# Patient Record
Sex: Male | Born: 1970 | Race: Black or African American | Hispanic: No | Marital: Single
Health system: Southern US, Community
[De-identification: ages and names within clinical notes are randomized; demographics above are authoritative.]

## PROBLEM LIST (undated history)

## (undated) DIAGNOSIS — I10 Essential (primary) hypertension: Secondary | ICD-10-CM

## (undated) DIAGNOSIS — H3321 Serous retinal detachment, right eye: Secondary | ICD-10-CM

## (undated) HISTORY — PX: EYE SURGERY: SHX253

---
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
## 2015-08-16 ENCOUNTER — Encounter (HOSPITAL_BASED_OUTPATIENT_CLINIC_OR_DEPARTMENT_OTHER): Payer: Self-pay | Admitting: Emergency Medicine

## 2015-08-16 ENCOUNTER — Emergency Department (HOSPITAL_BASED_OUTPATIENT_CLINIC_OR_DEPARTMENT_OTHER)
Admission: EM | Admit: 2015-08-16 | Discharge: 2015-08-16 | Disposition: A | Discharge status: Home / Self Care | Payer: BLUE CROSS/BLUE SHIELD | Attending: Emergency Medicine | Admitting: Emergency Medicine

## 2015-08-16 DIAGNOSIS — H5461 Unqualified visual loss, right eye, normal vision left eye: Secondary | ICD-10-CM | POA: Diagnosis present

## 2015-08-16 DIAGNOSIS — H3321 Serous retinal detachment, right eye: Secondary | ICD-10-CM | POA: Insufficient documentation

## 2015-08-16 DIAGNOSIS — Z973 Presence of spectacles and contact lenses: Secondary | ICD-10-CM | POA: Insufficient documentation

## 2015-08-16 MED ORDER — TETRACAINE HCL 0.5 % OP SOLN
OPHTHALMIC | Status: AC
  Administered 2015-08-16: 2 [drp]
  Filled 2015-08-16: qty 4

## 2015-08-16 NOTE — ED Notes (Signed)
Pt called for triage x2, no answer. 

## 2015-08-16 NOTE — ED Provider Notes (Signed)
CSN: [id number]     Arrival date & time 08/16/15  2249 History  By signing my name below, I, Phillis Haggis, attest that this documentation has been prepared under the direction and in the presence of Gwyneth Sprout, MD. Electronically Signed: Phillis Haggis, ED Scribe. 08/16/2015. 11:02 PM.  Chief Complaint  Patient presents with  . Loss of Vision   The history is provided by the patient. No language interpreter was used.  HPI Comments: Eric [name] is a 45 y.o. Male with a hx of cataracts who presents to the Emergency Department complaining of sudden onset right eye frontal vision loss onset one day ago. Pt states that he was at work when he could not see out of his right eye at 4 PM, although he retains mild peripheral view to the right. He reports that he can tell that the light in the room is on, but cannot see objects. "It feels like a shade has been pulled down over my eye." Pt also notes pressure on his eye, although he does not experience pain. Pt is able to see normally out of his left eye. Pt wears contacts. Pt has not seen his ophthalmologist for this problem. He denies hx of similar symptoms, family hx of glaucoma, HTN, DM, eye surgeries, nausea, or recent head injury.   No past medical history on file. No past surgical history on file. No family history on file. Social History  Substance Use Topics  . Smoking status: Not on file  . Smokeless tobacco: Not on file  . Alcohol Use: Not on file    Review of Systems  Eyes: Positive for visual disturbance. Negative for pain.  Gastrointestinal: Negative for nausea.  All other systems reviewed and are negative.  Allergies  Review of patient's allergies indicates not on file.  Home Medications   Prior to Admission medications   Not on File   BP 141/95 mmHg  Pulse 67  Resp 18  SpO2 99% Physical Exam  Constitutional: He is oriented to person, place, and time. He appears well-developed and well-nourished.  HENT:  Head:  Normocephalic and atraumatic.  Eyes: Pupils are equal, round, and reactive to light.  Equal pupils bilaterally; right eye can only distinguish light and dark; interocular pressure: 16  Neck: Normal range of motion. Neck supple.  Musculoskeletal: Normal range of motion.  Neurological: He is alert and oriented to person, place, and time.  Skin: Skin is warm and dry.  Psychiatric: He has a normal mood and affect. His behavior is normal.  Nursing note and vitals reviewed.   ED Course  Procedures (including critical care time) DIAGNOSTIC STUDIES: Oxygen Saturation is 99% on RA, normal by my interpretation.    COORDINATION OF CARE: 11:07 PM-Discussed treatment plan which includes [place] of eye with pt at bedside and pt agreed to plan.   EMERGENCY DEPARTMENT [place] OCULAR EXAM "Study: Limited Ultrasound of Orbit "  INDICATIONS: Vision loss  Linear probe utilized to obtain images in both long and short axis of the orbit having the patient look left and right if possible.  PERFORMED BY: Myself  IMAGES ARCHIVED?: Yes  LIMITATIONS: none  VIEWS USED: Right orbit  INTERPRETATION: Retinal detachment present    Labs Review Labs Reviewed - No data to display  Imaging Review No results found. I have personally reviewed and evaluated these images and lab results as part of my medical decision-making.   EKG Interpretation None      MDM   Final diagnoses:  Vision  loss of right eye  Retinal detachment, right    Patient is a 45 year old healthy male with no history of diabetes or hypertension presenting with 36 hours of acute painless vision loss that started yesterday around 4 PM when he got off for. He denies any injury to his head. No prior similar symptoms.  Patient has no eye pain. Pupils are reactive. Only light and dark perception of the right eye.  Patient wears contacts and has a history of mild cataracts without ever having surgery.  Bedside ultrasound shows a retinal  detachment. Spoke with Dr. Randon Goldsmith and he wanted to follow-up with patient tomorrow morning at 8:30.  I personally performed the services described in this documentation, which was scribed in my presence.  The recorded information has been reviewed and considered.      Gwyneth Sprout, MD 08/16/15 [phone]

## 2015-08-16 NOTE — ED Notes (Signed)
Called [facility] for Ophthalmology

## 2015-08-16 NOTE — ED Notes (Signed)
Pt in c/o loss of central vision in R eye onset 2 days ago. States has peripheral vision intact. Neuro intact.

## 2015-08-16 NOTE — ED Notes (Signed)
C/o of loss of vision straight ahead in rt eye onset yesterday  Denies inj  No pain

## 2019-04-18 ENCOUNTER — Emergency Department (HOSPITAL_COMMUNITY): Payer: Managed Care, Other (non HMO)

## 2019-04-18 ENCOUNTER — Encounter (HOSPITAL_COMMUNITY): Payer: Self-pay

## 2019-04-18 ENCOUNTER — Encounter (HOSPITAL_COMMUNITY): Admission: EM | Disposition: A | Discharge status: Rehabilitation Facility | Payer: Self-pay | Source: Home / Self Care

## 2019-04-18 ENCOUNTER — Emergency Department (HOSPITAL_COMMUNITY): Payer: Managed Care, Other (non HMO) | Admitting: Anesthesiology

## 2019-04-18 ENCOUNTER — Other Ambulatory Visit: Payer: Self-pay

## 2019-04-18 ENCOUNTER — Inpatient Hospital Stay (HOSPITAL_COMMUNITY)
Admission: EM | Admit: 2019-04-18 | Discharge: 2019-04-27 | DRG: 481 | Disposition: A | Discharge status: Rehabilitation Facility | Payer: Managed Care, Other (non HMO) | Attending: General Surgery | Admitting: General Surgery

## 2019-04-18 DIAGNOSIS — Z419 Encounter for procedure for purposes other than remedying health state, unspecified: Secondary | ICD-10-CM

## 2019-04-18 DIAGNOSIS — S0219XA Other fracture of base of skull, initial encounter for closed fracture: Secondary | ICD-10-CM | POA: Diagnosis present

## 2019-04-18 DIAGNOSIS — H4311 Vitreous hemorrhage, right eye: Secondary | ICD-10-CM | POA: Diagnosis present

## 2019-04-18 DIAGNOSIS — S8991XA Unspecified injury of right lower leg, initial encounter: Secondary | ICD-10-CM | POA: Insufficient documentation

## 2019-04-18 DIAGNOSIS — H1131 Conjunctival hemorrhage, right eye: Secondary | ICD-10-CM | POA: Diagnosis present

## 2019-04-18 DIAGNOSIS — S0591XA Unspecified injury of right eye and orbit, initial encounter: Secondary | ICD-10-CM

## 2019-04-18 DIAGNOSIS — S72402B Unspecified fracture of lower end of left femur, initial encounter for open fracture type I or II: Secondary | ICD-10-CM | POA: Diagnosis not present

## 2019-04-18 DIAGNOSIS — R402252 Coma scale, best verbal response, oriented, at arrival to emergency department: Secondary | ICD-10-CM | POA: Diagnosis present

## 2019-04-18 DIAGNOSIS — I10 Essential (primary) hypertension: Secondary | ICD-10-CM | POA: Diagnosis present

## 2019-04-18 DIAGNOSIS — Z20828 Contact with and (suspected) exposure to other viral communicable diseases: Secondary | ICD-10-CM | POA: Diagnosis present

## 2019-04-18 DIAGNOSIS — N179 Acute kidney failure, unspecified: Secondary | ICD-10-CM | POA: Diagnosis present

## 2019-04-18 DIAGNOSIS — Y92410 Unspecified street and highway as the place of occurrence of the external cause: Secondary | ICD-10-CM

## 2019-04-18 DIAGNOSIS — S72352B Displaced comminuted fracture of shaft of left femur, initial encounter for open fracture type I or II: Secondary | ICD-10-CM | POA: Diagnosis not present

## 2019-04-18 DIAGNOSIS — S82141A Displaced bicondylar fracture of right tibia, initial encounter for closed fracture: Secondary | ICD-10-CM

## 2019-04-18 DIAGNOSIS — E871 Hypo-osmolality and hyponatremia: Secondary | ICD-10-CM | POA: Diagnosis present

## 2019-04-18 DIAGNOSIS — S0240CA Maxillary fracture, right side, initial encounter for closed fracture: Secondary | ICD-10-CM

## 2019-04-18 DIAGNOSIS — S0285XA Fracture of orbit, unspecified, initial encounter for closed fracture: Secondary | ICD-10-CM | POA: Diagnosis present

## 2019-04-18 DIAGNOSIS — S0121XA Laceration without foreign body of nose, initial encounter: Secondary | ICD-10-CM

## 2019-04-18 DIAGNOSIS — S0530XA Ocular laceration without prolapse or loss of intraocular tissue, unspecified eye, initial encounter: Secondary | ICD-10-CM | POA: Diagnosis present

## 2019-04-18 DIAGNOSIS — S058X1A Other injuries of right eye and orbit, initial encounter: Secondary | ICD-10-CM | POA: Diagnosis present

## 2019-04-18 DIAGNOSIS — K5900 Constipation, unspecified: Secondary | ICD-10-CM | POA: Diagnosis present

## 2019-04-18 DIAGNOSIS — R52 Pain, unspecified: Secondary | ICD-10-CM | POA: Diagnosis not present

## 2019-04-18 DIAGNOSIS — R402362 Coma scale, best motor response, obeys commands, at arrival to emergency department: Secondary | ICD-10-CM | POA: Diagnosis present

## 2019-04-18 DIAGNOSIS — R402142 Coma scale, eyes open, spontaneous, at arrival to emergency department: Secondary | ICD-10-CM | POA: Diagnosis present

## 2019-04-18 HISTORY — DX: Serous retinal detachment, right eye: H33.21

## 2019-04-18 HISTORY — PX: FEMUR IM NAIL: SHX1597

## 2019-04-18 HISTORY — PX: EYE EXAMINATION UNDER ANESTHESIA: SHX1560

## 2019-04-18 LAB — CBC
HCT: 48.4 % (ref 39.0–52.0)
Hemoglobin: 16.3 g/dL (ref 13.0–17.0)
MCH: 29.8 pg (ref 26.0–34.0)
MCHC: 33.7 g/dL (ref 30.0–36.0)
MCV: 88.5 fL (ref 80.0–100.0)
Platelets: 210 10*3/uL (ref 150–400)
RBC: 5.47 MIL/uL (ref 4.22–5.81)
RDW: 12.8 % (ref 11.5–15.5)
WBC: 13.8 10*3/uL — ABNORMAL HIGH (ref 4.0–10.5)
nRBC: 0 % (ref 0.0–0.2)

## 2019-04-18 LAB — I-STAT CHEM 8, ED
BUN: 16 mg/dL (ref 6–20)
Calcium, Ion: 1.01 mmol/L — ABNORMAL LOW (ref 1.15–1.40)
Chloride: 105 mmol/L (ref 98–111)
Creatinine, Ser: 1.1 mg/dL (ref 0.61–1.24)
Glucose, Bld: 132 mg/dL — ABNORMAL HIGH (ref 70–99)
HCT: 49 % (ref 39.0–52.0)
Hemoglobin: 16.7 g/dL (ref 13.0–17.0)
Potassium: 5.3 mmol/L — ABNORMAL HIGH (ref 3.5–5.1)
Sodium: 139 mmol/L (ref 135–145)
TCO2: 25 mmol/L (ref 22–32)

## 2019-04-18 LAB — COMPREHENSIVE METABOLIC PANEL
ALT: 51 U/L — ABNORMAL HIGH (ref 0–44)
AST: 37 U/L (ref 15–41)
Albumin: 3.9 g/dL (ref 3.5–5.0)
Alkaline Phosphatase: 83 U/L (ref 38–126)
Anion gap: 11 (ref 5–15)
BUN: 11 mg/dL (ref 6–20)
CO2: 21 mmol/L — ABNORMAL LOW (ref 22–32)
Calcium: 8.8 mg/dL — ABNORMAL LOW (ref 8.9–10.3)
Chloride: 108 mmol/L (ref 98–111)
Creatinine, Ser: 1.22 mg/dL (ref 0.61–1.24)
GFR calc Af Amer: >60 mL/min (ref >60)
GFR calc non Af Amer: >60 mL/min (ref >60)
Glucose, Bld: 151 mg/dL — ABNORMAL HIGH (ref 70–99)
Potassium: 3.4 mmol/L — ABNORMAL LOW (ref 3.5–5.1)
Sodium: 140 mmol/L (ref 135–145)
Total Bilirubin: 0.5 mg/dL (ref 0.3–1.2)
Total Protein: 7.1 g/dL (ref 6.5–8.1)

## 2019-04-18 LAB — LACTIC ACID, PLASMA: Lactic Acid, Venous: 3.1 mmol/L (ref 0.5–1.9)

## 2019-04-18 LAB — PROTIME-INR
INR: 1 (ref 0.8–1.2)
Prothrombin Time: 12.6 seconds (ref 11.4–15.2)

## 2019-04-18 LAB — CDS SEROLOGY

## 2019-04-18 LAB — SAMPLE TO BLOOD BANK

## 2019-04-18 LAB — ETHANOL: Alcohol, Ethyl (B): <10 mg/dL (ref <10)

## 2019-04-18 LAB — SARS CORONAVIRUS 2 BY RT PCR (HOSPITAL ORDER, PERFORMED IN ~~LOC~~ HOSPITAL LAB): SARS Coronavirus 2: NEGATIVE
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
## 2019-04-18 IMAGING — DX DG KNEE 1-2V PORT*R*
2 series · 2 of 2 positions shown · non-contrast
Comparison: None.

CLINICAL DATA: Injury

EXAM:
PORTABLE RIGHT KNEE - 1-2 VIEW

[knee ap]
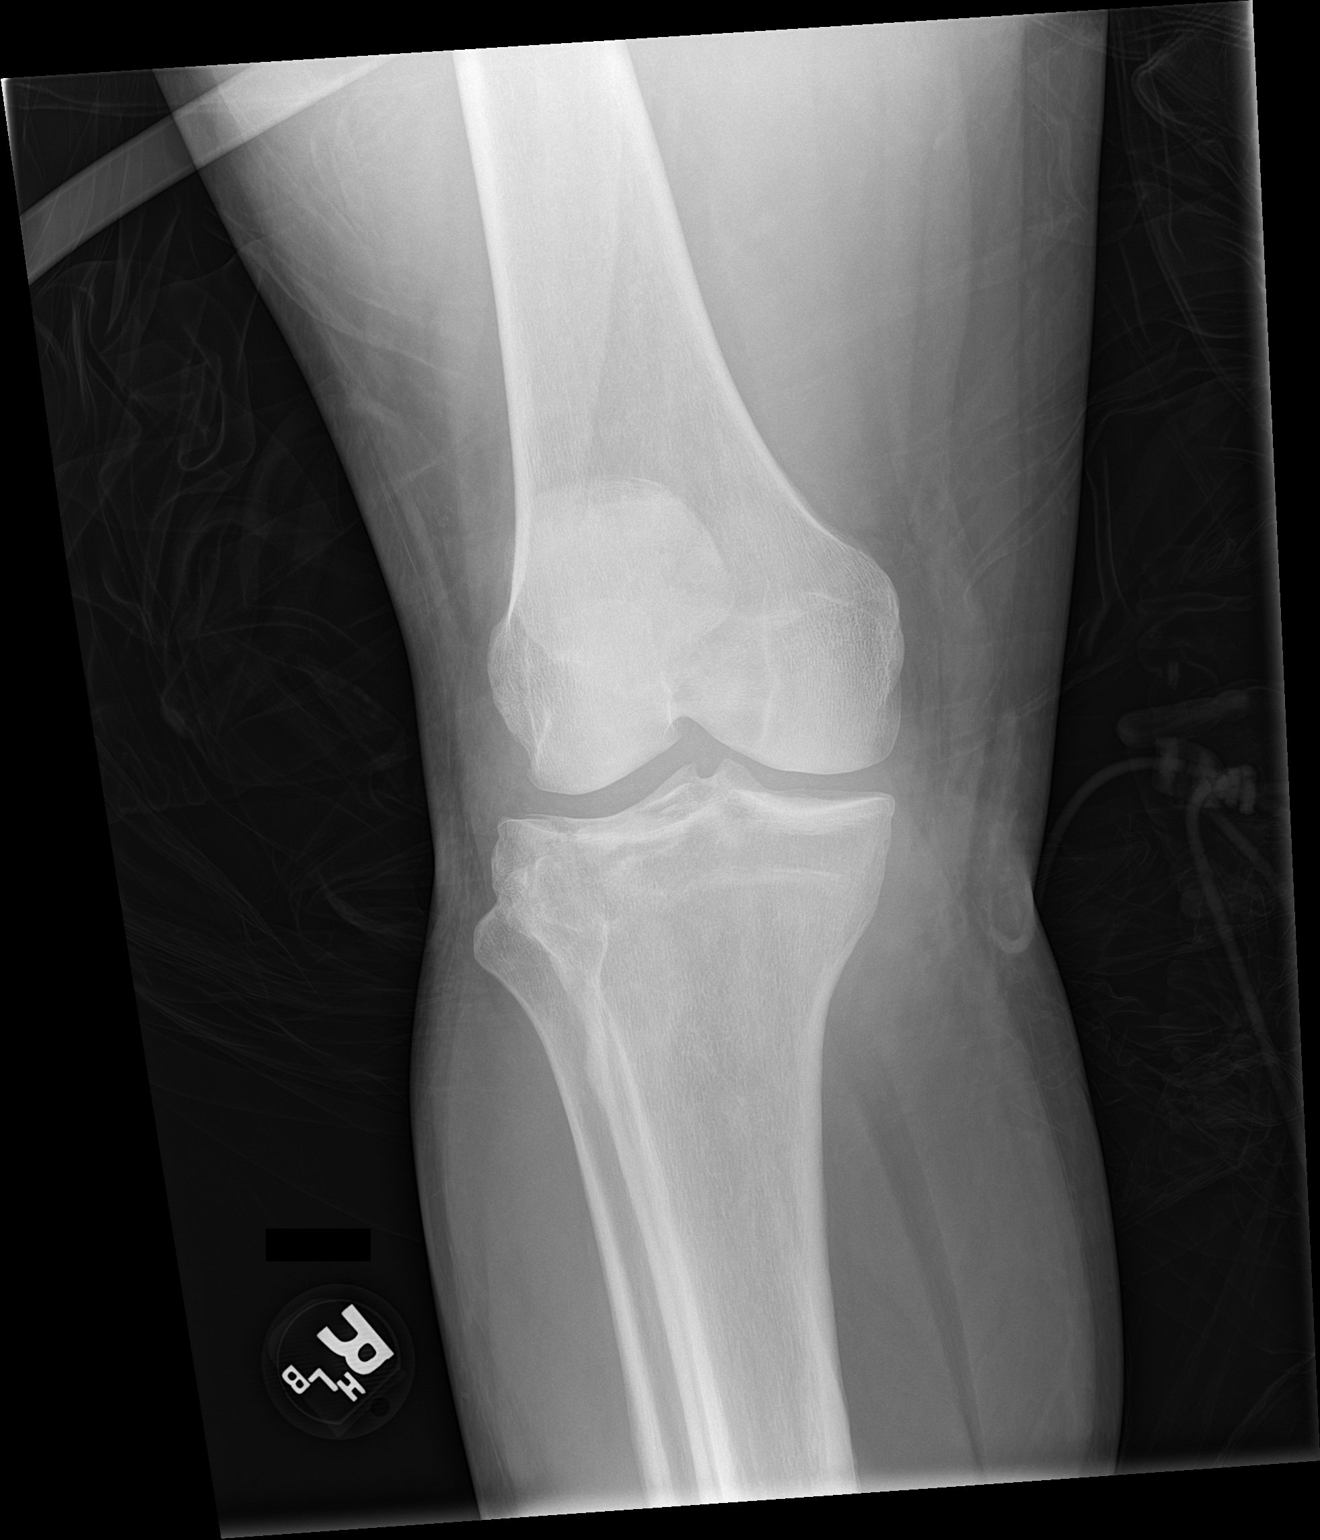

[knee lat]
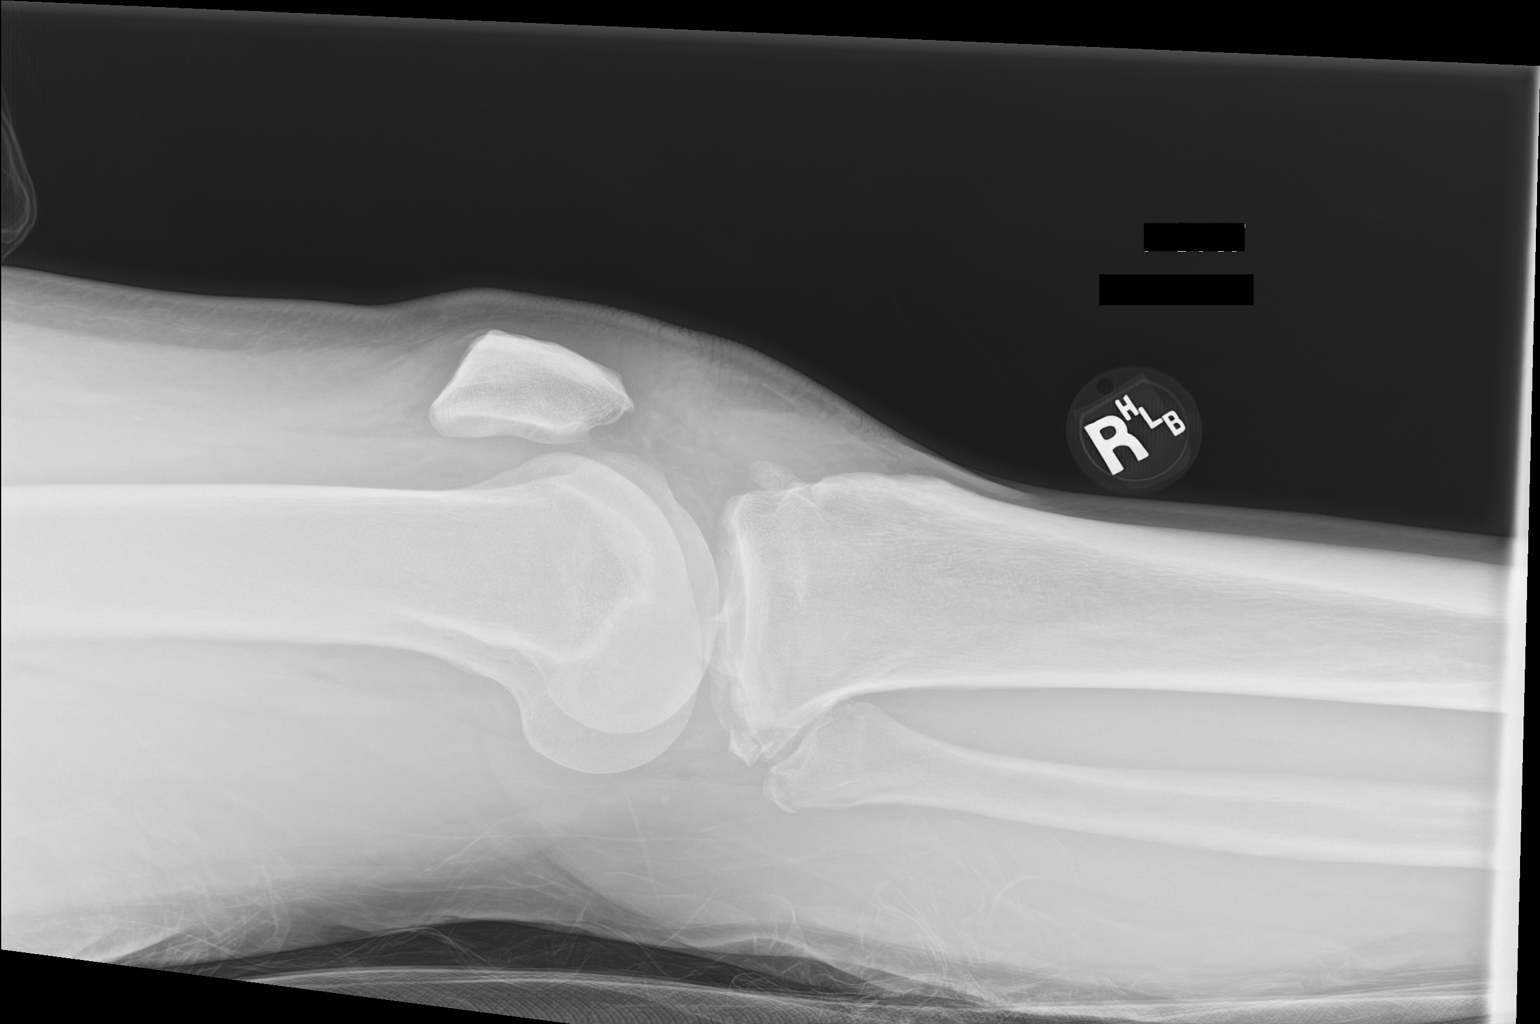

[2 of 2 positions shown; findings below may reference images not displayed]

FINDINGS: There is a mildly depressed fracture of the lateral right tibial
plateau. Additional subtle fracture of the tip of the proximal right
fibula. Moderate lipohemarthrosis of the right knee. There is a high
riding position of the patella with apparent discontinuity of the
patellar tendon, concerning for patellar tendon rupture.
IMPRESSION: 1. There is a mildly depressed fracture of the lateral right tibial
plateau. Moderate lipohemarthrosis of the right knee.

2. Additional subtle fracture of the tip of the proximal right
fibula.

3. There is a high riding position of the patella with apparent
discontinuity of the patellar tendon, concerning for patellar tendon
rupture.

## 2019-04-18 IMAGING — RF DG FEMUR 2+V*L*
1 series · 3 of 3 positions shown · non-contrast
Comparison: None.

CLINICAL DATA: Left femoral fracture

EXAM:
LEFT FEMUR 2 VIEWS; DG C-ARM 1-60 MIN

[Series 1: run · 3 of 3 slices shown]
[im 1/3]
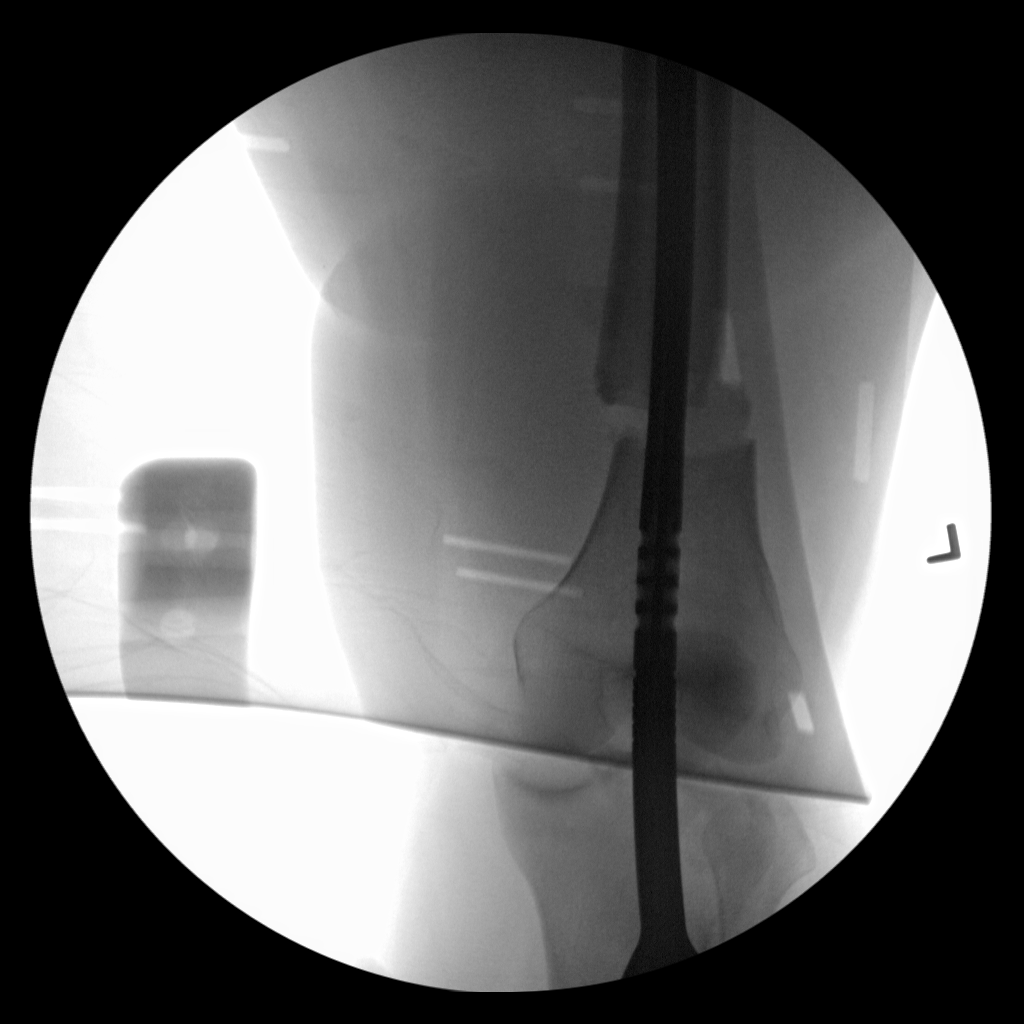
[im 2/3]
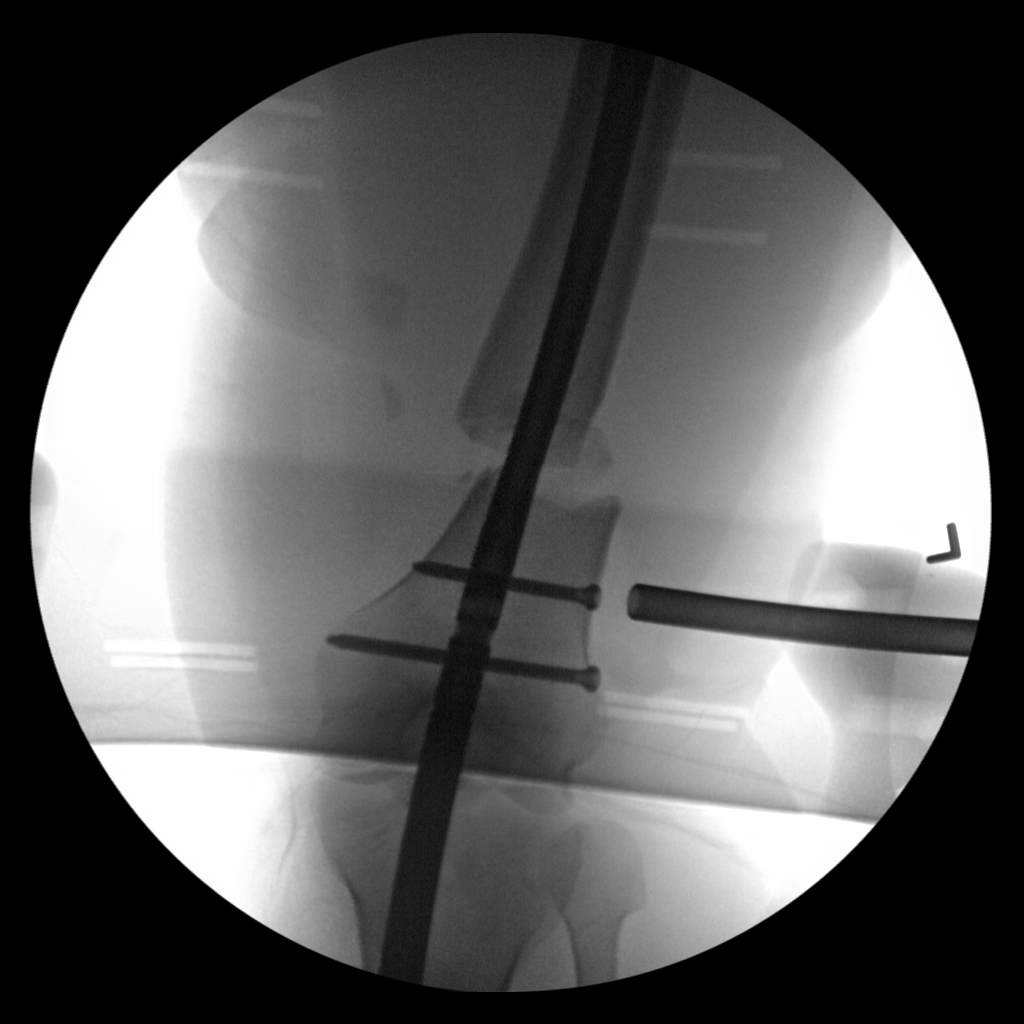
[im 3/3]
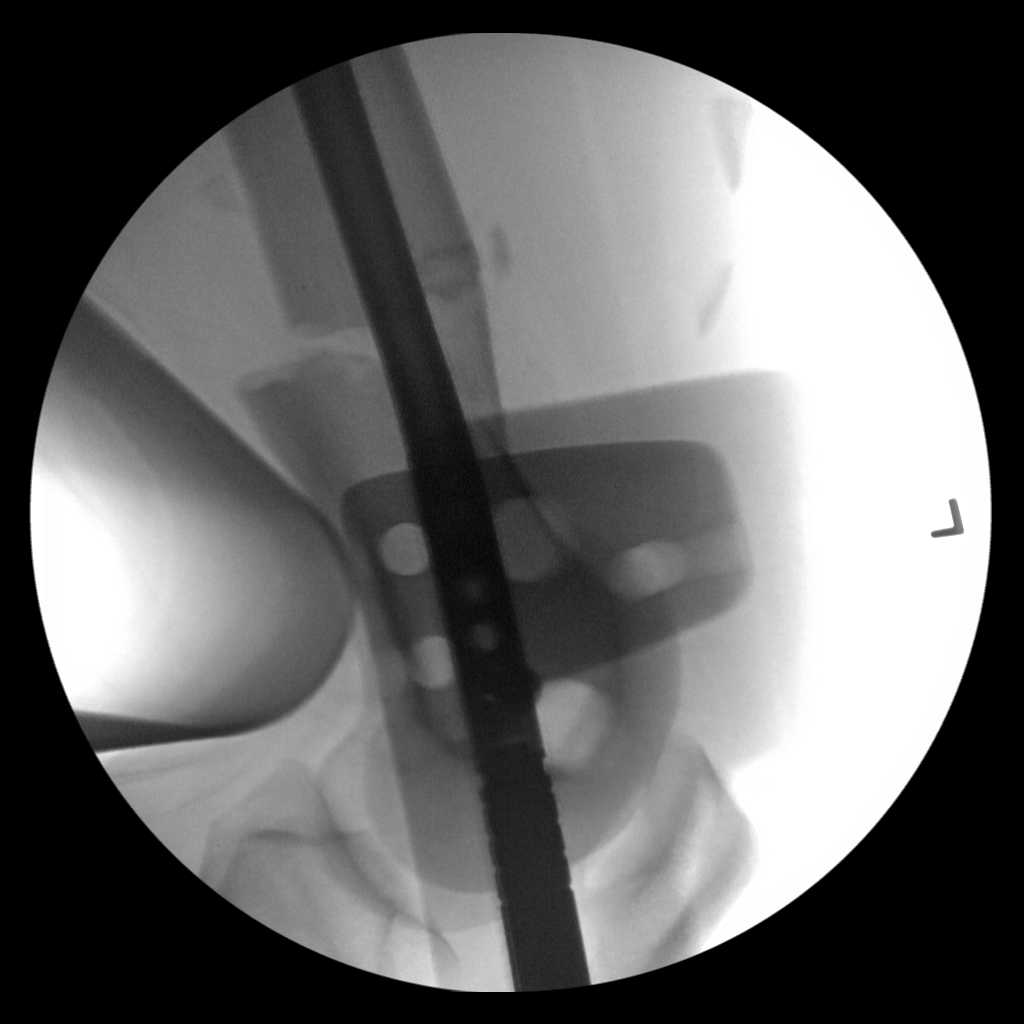

[3 of 3 positions shown; findings below may reference images not displayed]

FLUOROSCOPY TIME:  Fluoroscopy Time:  Not available

Radiation Exposure Index (if provided by the fluoroscopic device):
Not available

Number of Acquired Spot Images: 3
FINDINGS: Medullary rod is noted traversing the femoral fracture with fixation
screws distally. Fracture fragments are somewhat reduced
IMPRESSION: ORIF of distal left femoral fracture.
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
## 2019-04-18 SURGERY — INSERTION, INTRAMEDULLARY ROD, FEMUR, RETROGRADE
Anesthesia: General | Site: Leg Upper | Laterality: Right

## 2019-04-18 SURGERY — Surgical Case
Anesthesia: *Unknown

## 2019-04-18 MED ORDER — FENTANYL CITRATE (PF) 100 MCG/2ML IJ SOLN
INTRAMUSCULAR | Status: DC | PRN
  Administered 2019-04-18: 100 ug via INTRAVENOUS

## 2019-04-18 MED ORDER — LACTATED RINGERS IV SOLN
INTRAVENOUS | Status: DC | PRN
  Administered 2019-04-18: 23:00:00 via INTRAVENOUS

## 2019-04-18 MED ORDER — CEFAZOLIN SODIUM-DEXTROSE 2-4 GM/100ML-% IV SOLN
INTRAVENOUS | Status: AC
  Filled 2019-04-18: qty 100

## 2019-04-18 MED ORDER — TETANUS-DIPHTH-ACELL PERTUSSIS 5-2.5-18.5 LF-MCG/0.5 IM SUSP
0.5000 mL | Freq: Once | INTRAMUSCULAR | Status: AC
  Administered 2019-04-18: 0.5 mL via INTRAMUSCULAR

## 2019-04-18 MED ORDER — PROPOFOL 10 MG/ML IV BOLUS
INTRAVENOUS | Status: DC | PRN
  Administered 2019-04-18: 130 mg via INTRAVENOUS

## 2019-04-18 MED ORDER — CEFAZOLIN SODIUM-DEXTROSE 2-4 GM/100ML-% IV SOLN
2.0000 g | Freq: Once | INTRAVENOUS | Status: AC
  Administered 2019-04-18: 2 g via INTRAVENOUS

## 2019-04-18 MED ORDER — HYDROMORPHONE HCL 1 MG/ML IJ SOLN
1.0000 mg | Freq: Once | INTRAMUSCULAR | Status: AC
  Administered 2019-04-18: 1 mg via INTRAVENOUS
  Filled 2019-04-18: qty 1

## 2019-04-18 MED ORDER — PROPOFOL 10 MG/ML IV BOLUS
INTRAVENOUS | Status: AC
  Filled 2019-04-18: qty 20

## 2019-04-18 MED ORDER — MIDAZOLAM HCL 2 MG/2ML IJ SOLN
INTRAMUSCULAR | Status: AC
  Filled 2019-04-18: qty 2

## 2019-04-18 MED ORDER — LIDOCAINE HCL (CARDIAC) PF 50 MG/5ML IV SOSY
PREFILLED_SYRINGE | INTRAVENOUS | Status: DC | PRN
  Administered 2019-04-18: 60 mg via INTRAVENOUS

## 2019-04-18 MED ORDER — FENTANYL CITRATE (PF) 100 MCG/2ML IJ SOLN
INTRAMUSCULAR | Status: AC | PRN
  Administered 2019-04-18: 100 ug via INTRAVENOUS

## 2019-04-18 MED ORDER — FENTANYL CITRATE (PF) 250 MCG/5ML IJ SOLN
INTRAMUSCULAR | Status: AC
  Filled 2019-04-18: qty 5

## 2019-04-18 MED ORDER — ROCURONIUM BROMIDE 100 MG/10ML IV SOLN
INTRAVENOUS | Status: DC | PRN
  Administered 2019-04-18: 100 mg via INTRAVENOUS

## 2019-04-18 MED ORDER — CEFAZOLIN SODIUM-DEXTROSE 2-3 GM-%(50ML) IV SOLR
INTRAVENOUS | Status: DC | PRN
  Administered 2019-04-18: 2 g via INTRAVENOUS

## 2019-04-18 MED ORDER — DEXAMETHASONE SODIUM PHOSPHATE 4 MG/ML IJ SOLN
INTRAMUSCULAR | Status: DC | PRN
  Administered 2019-04-18: 4 mg via INTRAVENOUS

## 2019-04-18 MED ORDER — IOHEXOL 300 MG/ML  SOLN
100.0000 mL | Freq: Once | INTRAMUSCULAR | Status: AC | PRN
  Administered 2019-04-18: 100 mL via INTRAVENOUS

## 2019-04-18 MED ORDER — 0.9 % SODIUM CHLORIDE (POUR BTL) OPTIME
TOPICAL | Status: DC | PRN
  Administered 2019-04-18: 1000 mL

## 2019-04-18 SURGICAL SUPPLY — 62 items
BIT DRILL CALIBRATED 4.3MMX365 (DRILL) ×2 IMPLANT
BIT DRILL CROWE PNT TWST 4.5MM (DRILL) ×2 IMPLANT
BLADE SURG 10 STRL SS (BLADE) IMPLANT
BNDG COHESIVE 6X5 TAN STRL LF (GAUZE/BANDAGES/DRESSINGS) ×4 IMPLANT
BNDG ELASTIC 4X5.8 VLCR STR LF (GAUZE/BANDAGES/DRESSINGS) ×4 IMPLANT
BNDG ELASTIC 6X5.8 VLCR STR LF (GAUZE/BANDAGES/DRESSINGS) ×4 IMPLANT
BNDG GAUZE ELAST 4 BULKY (GAUZE/BANDAGES/DRESSINGS) ×4 IMPLANT
CLEANER TIP ELECTROSURG 2X2 (MISCELLANEOUS) ×4 IMPLANT
COVER MAYO STAND STRL (DRAPES) IMPLANT
COVER SURGICAL LIGHT HANDLE (MISCELLANEOUS) ×8 IMPLANT
COVER WAND RF STERILE (DRAPES) ×4 IMPLANT
CUFF TOURN SGL QUICK 34 (TOURNIQUET CUFF)
CUFF TOURN SGL QUICK 42 (TOURNIQUET CUFF) IMPLANT
CUFF TRNQT CYL 34X4.125X (TOURNIQUET CUFF) IMPLANT
DRAPE C-ARM 42X72 X-RAY (DRAPES) ×4 IMPLANT
DRAPE HALF SHEET 40X57 (DRAPES) ×8 IMPLANT
DRAPE IMP U-DRAPE 54X76 (DRAPES) ×4 IMPLANT
DRAPE U-SHAPE 47X51 STRL (DRAPES) ×4 IMPLANT
DRILL CALIBRATED 4.3MMX365 (DRILL) ×4
DRILL CROWE POINT TWIST 4.5MM (DRILL) ×4
DRSG EMULSION OIL 3X3 NADH (GAUZE/BANDAGES/DRESSINGS) ×4 IMPLANT
DURAPREP 26ML APPLICATOR (WOUND CARE) ×8 IMPLANT
ELECT REM PT RETURN 9FT ADLT (ELECTROSURGICAL) ×4
ELECTRODE REM PT RTRN 9FT ADLT (ELECTROSURGICAL) ×2 IMPLANT
GAUZE SPONGE 4X4 12PLY STRL (GAUZE/BANDAGES/DRESSINGS) ×4 IMPLANT
GLOVE BIOGEL PI IND STRL 9 (GLOVE) ×2 IMPLANT
GLOVE BIOGEL PI INDICATOR 9 (GLOVE) ×2
GLOVE SURG ORTHO 9.0 STRL STRW (GLOVE) ×4 IMPLANT
GOWN STRL REUS W/ TWL XL LVL3 (GOWN DISPOSABLE) ×4 IMPLANT
GOWN STRL REUS W/TWL XL LVL3 (GOWN DISPOSABLE) ×4
GUIDEPIN 3.2X17.5 THRD DISP (PIN) ×4 IMPLANT
GUIDEWIRE BEAD TIP (WIRE) ×4 IMPLANT
IMMOBILIZER KNEE 24 THIGH 36 (MISCELLANEOUS) ×2 IMPLANT
IMMOBILIZER KNEE 24 UNIV (MISCELLANEOUS) ×4
KIT BASIN OR (CUSTOM PROCEDURE TRAY) ×4 IMPLANT
KIT TURNOVER KIT B (KITS) ×4 IMPLANT
MANIFOLD NEPTUNE II (INSTRUMENTS) ×4 IMPLANT
NAIL FEM RETRO 10.5X250 (Nail) ×4 IMPLANT
NEEDLE HYPO 21X1.5 SAFETY (NEEDLE) IMPLANT
NEEDLE HYPO 25GX1X1/2 BEV (NEEDLE) IMPLANT
NS IRRIG 1000ML POUR BTL (IV SOLUTION) ×4 IMPLANT
PACK ORTHO EXTREMITY (CUSTOM PROCEDURE TRAY) ×4 IMPLANT
PACK UNIVERSAL I (CUSTOM PROCEDURE TRAY) IMPLANT
PAD ABD 8X10 STRL (GAUZE/BANDAGES/DRESSINGS) ×4 IMPLANT
PAD ARMBOARD 7.5X6 YLW CONV (MISCELLANEOUS) ×8 IMPLANT
PENCIL BUTTON HOLSTER BLD 10FT (ELECTRODE) ×4 IMPLANT
SCREW CORT TI DBL LEAD 5X50 (Screw) ×4 IMPLANT
SCREW CORT TI DBL LEAD 5X75 (Screw) ×4 IMPLANT
SPONGE LAP 18X18 RF (DISPOSABLE) ×4 IMPLANT
STAPLER VISISTAT 35W (STAPLE) ×4 IMPLANT
SUCTION FRAZIER HANDLE 10FR (MISCELLANEOUS)
SUCTION TUBE FRAZIER 10FR DISP (MISCELLANEOUS) IMPLANT
SUT VIC AB 0 CT1 27 (SUTURE) ×2
SUT VIC AB 0 CT1 27XBRD ANBCTR (SUTURE) ×2 IMPLANT
SUT VIC AB 2-0 CTB1 (SUTURE) ×4 IMPLANT
SYR BULB IRRIGATION 50ML (SYRINGE) ×4 IMPLANT
SYR CONTROL 10ML LL (SYRINGE) IMPLANT
TOWEL GREEN STERILE (TOWEL DISPOSABLE) ×4 IMPLANT
TOWEL GREEN STERILE FF (TOWEL DISPOSABLE) ×4 IMPLANT
TUBE CONNECTING 12'X1/4 (SUCTIONS) ×1
TUBE CONNECTING 12X1/4 (SUCTIONS) ×3 IMPLANT
WATER STERILE IRR 1000ML POUR (IV SOLUTION) ×4 IMPLANT

## 2019-04-18 NOTE — ED Notes (Signed)
Pt refusing to remain on back. Pt insisting on laying on side despite repeated education. Leg is unsecured and changing positions each time he rolls. C-spine education also provided, pt again refuses to lay on back.

## 2019-04-18 NOTE — ED Notes (Signed)
OR updated re: COVID test-  Rolene Arbour, RN TRN

## 2019-04-18 NOTE — Anesthesia Preprocedure Evaluation (Addendum)
Anesthesia Evaluation  Patient identified by MRN, date of birth, ID band  Reviewed: Allergy & Precautions, NPO status , Patient's Chart, lab work & pertinent test results, Unable to perform ROS - Chart review onlyPreop documentation limited or incomplete due to emergent nature of procedure.  History of Anesthesia Complications Negative for: history of anesthetic complications  Airway Mallampati: IV  TM Distance: >3 FB Neck ROM: Full   Comment: Limited exam due to pain Dental  (+) Dental Advisory Given   Pulmonary neg pulmonary ROS, neg COPD, neg recent URI, Not current smoker,    breath sounds clear to auscultation       Cardiovascular hypertension, (-) angina(-) Past MI and (-) CHF  Rhythm:Regular     Neuro/Psych negative neurological ROS     GI/Hepatic negative GI ROS, Neg liver ROS,   Endo/Other  negative endocrine ROS  Renal/GU negative Renal ROS     Musculoskeletal   Abdominal   Peds  Hematology   Anesthesia Other Findings 1.  Complex laceration right nasal ala 2.  Right orbital fracture with globe injury.  Orbital fracture is minimally displaced and will not require surgical intervention.  Globe injury is being treated by ophthalmology. 3.  Left frontal sinus fracture, anterior and posterior table, nondisplaced.   4.  Right maxillary fracture with possible palate mobility on the right side.  5. Open left femur fx  Reproductive/Obstetrics                           Anesthesia Physical Anesthesia Plan  ASA: II and emergent  Anesthesia Plan: General   Post-op Pain Management:    Induction: Intravenous  PONV Risk Score and Plan: 2 and Ondansetron and Dexamethasone  Airway Management Planned: Oral ETT  Additional Equipment:   Intra-op Plan:   Post-operative Plan: Possible Post-op intubation/ventilation and Extubation in OR  Informed Consent: I have reviewed the patients History  and Physical, chart, labs and discussed the procedure including the risks, benefits and alternatives for the proposed anesthesia with the patient or authorized representative who has indicated his/her understanding and acceptance.     Dental advisory given  Plan Discussed with: CRNA and Surgeon  Anesthesia Plan Comments:         Anesthesia Quick Evaluation
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
## 2019-04-18 NOTE — ED Notes (Signed)
Wife-- Dierdre Searles-- [phone]

## 2019-04-18 NOTE — ED Notes (Signed)
Pt transported to CT with RN
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
## 2019-04-18 NOTE — Anesthesia Procedure Notes (Signed)
Performed by: Emonie Espericueta M, CRNA       

## 2019-04-18 NOTE — ED Notes (Signed)
Pt is refusing to leave leads/monitoring equipment on. Consistent education, redirection and support provided, however pt refuses to leave monitoring equipment on. Spot checks are stable w/ sats around 94%, HR in the 90s. Strong pedal pulses, pt will not remain still enough for BP monitoring
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
## 2019-04-18 NOTE — Consult Note (Addendum)
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 ophthalmology exam recommended. Small bilateral orbital emphysema noted. Bilateral periorbital contusions. The retro-orbital fat are preserved. No retro-orbital hematoma or fluid collection. Sinuses: Opacifications of the paranasal sinuses with air-fluid level consistent with hemosinus. The mastoid air cells are clear. Cerumen noted in the right external auditory canal. Soft tissues: Extensive soft tissue swelling over the nose and maxillary area. There is laceration of the right nostril. CT CERVICAL SPINE FINDINGS Alignment: Normal. Skull base and vertebrae: No acute fracture. No primary bone lesion or focal pathologic process. Soft tissues and spinal canal: No prevertebral fluid or swelling. No visible canal hematoma. Disc levels:  No acute findings. No degenerative changes. Upper chest: Negative. Other: None IMPRESSION: 1. No acute intracranial pathology. Faint linear density in the anterior interhemispheric fissure most likely represents the falx. 2. No acute/traumatic cervical spine pathology. 3. Extensive facial bone fractures as described predominantly involving the right  side of the face. There is a nondisplaced fracture through the right maxilla extending posteriorly into the hard palate. 4. Oblong appearance of the right globe with displaced appearance of the right lens. Correlation with ophthalmological exam recommended. 5. Nondisplaced fracture of the left frontal bone through the anterior and posterior walls of the left frontal sinus. Linear lucency through the right frontal skull base along the right orbital roof may represent a vascular groove or a nondisplaced fracture. Electronically Signed   By: Elgie CollardArash  Radparvar M.D.   On: 04/18/2019 19:43    RUE:[id number]:Negative except as listed in admit H&P  Blood pressure 133/84, pulse (!) 101, temperature (!) 96.7 F (35.9 C), temperature source Temporal, resp. rate 17, height 5\' 10"  (1.778 m), weight 104.3 kg, SpO2 92 %.  PHYSICAL EXAM: Overall appearance: Sleepy but arousable, lying on his right side. Head:  Normocephalic, multiple abrasions of the forehead scalp. Ears: External ears look normal and healthy. Nose: Full-thickness laceration, Y-shaped involving the right nasal ala with some skin edge necrosis. Oral Cavity/Pharynx:  There are no mucosal lesions or masses identified.  Occlusion seems to be intact. Larynx/Hypopharynx: Deferred  Neuro: Difficult to evaluate due to his having received pain medicine. Neck: Cervical collar in place.  Studies Reviewed: Maxillofacial and head CT reviewed.  Procedures: Right nasal ala repair.  Betadine solution was used on the skin.  1% Xylocaine with epinephrine was infiltrated with a 27-gauge needle.  Combination of interrupted and running 5-0 chromic sutures were used to reapproximate the external skin.  He tolerated this well.  Covid swab was performed by myself using the left nasal cavity due to the facial injuries.   Assessment/Plan: 1.  Complex laceration right nasal ala, repaired in the emergency department.  2.  Right orbital fracture with globe injury.  Orbital  fracture is minimally displaced and will not require surgical intervention.  Globe injury is being treated by ophthalmology.  3.  Left frontal sinus fracture, anterior and posterior table, nondisplaced.  No surgical intervention needed.  4.  Right maxillary fracture with possible palate mobility on the right side.  Will reevaluate in a few days.  Potentially may need open reduction and internal fixation or possible maxillomandibular fixation.  [name] 04/18/2019, 9:54 PM
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
## 2019-04-18 NOTE — Anesthesia Procedure Notes (Signed)
Procedure Name: Intubation Date/Time: 04/18/2019 11:11 PM Performed by: Suzy Bouchard, CRNA Pre-anesthesia Checklist: Patient identified, Emergency Drugs available, Suction available, Patient being monitored and Timeout performed Patient Re-evaluated:Patient Re-evaluated prior to induction Oxygen Delivery Method: Circle system utilized Preoxygenation: Pre-oxygenation with 100% oxygen Induction Type: IV induction and Rapid sequence Laryngoscope Size: Glidescope and 3 Grade View: Grade II Tube type: Oral Tube size: 7.5 mm Number of attempts: 2 Airway Equipment and Method: Video-laryngoscopy and Stylet Placement Confirmation: ETT inserted through vocal cords under direct vision,  positive ETCO2 and breath sounds checked- equal and bilateral Secured at: 24 cm Tube secured with: Tape Dental Injury: Teeth and Oropharynx as per pre-operative assessment  Difficulty Due To: Difficult Airway- due to cervical collar and Difficult Airway- due to large tongue Comments: Intubated on second attempt (1, AAuston 2 C Moser) with glidescope.  Cervical collar and large tongue made visualization difficult; patient has large redundant epiglottis.

## 2019-04-18 NOTE — ED Notes (Signed)
Dr. Constance Holster and Dr. Manuella Ghazi at bedside

## 2019-04-19 ENCOUNTER — Other Ambulatory Visit: Payer: Self-pay

## 2019-04-19 ENCOUNTER — Emergency Department (HOSPITAL_COMMUNITY): Payer: Managed Care, Other (non HMO)

## 2019-04-19 ENCOUNTER — Encounter (HOSPITAL_COMMUNITY): Payer: Self-pay | Admitting: Orthopedic Surgery

## 2019-04-19 DIAGNOSIS — S72402B Unspecified fracture of lower end of left femur, initial encounter for open fracture type I or II: Secondary | ICD-10-CM

## 2019-04-19 DIAGNOSIS — S0240CA Maxillary fracture, right side, initial encounter for closed fracture: Secondary | ICD-10-CM | POA: Diagnosis present

## 2019-04-19 DIAGNOSIS — S8991XS Unspecified injury of right lower leg, sequela: Secondary | ICD-10-CM | POA: Diagnosis not present

## 2019-04-19 DIAGNOSIS — K5901 Slow transit constipation: Secondary | ICD-10-CM | POA: Diagnosis not present

## 2019-04-19 DIAGNOSIS — M19011 Primary osteoarthritis, right shoulder: Secondary | ICD-10-CM | POA: Diagnosis not present

## 2019-04-19 DIAGNOSIS — E871 Hypo-osmolality and hyponatremia: Secondary | ICD-10-CM | POA: Diagnosis present

## 2019-04-19 DIAGNOSIS — G8918 Other acute postprocedural pain: Secondary | ICD-10-CM | POA: Diagnosis not present

## 2019-04-19 DIAGNOSIS — N179 Acute kidney failure, unspecified: Secondary | ICD-10-CM | POA: Diagnosis present

## 2019-04-19 DIAGNOSIS — S82141A Displaced bicondylar fracture of right tibia, initial encounter for closed fracture: Secondary | ICD-10-CM | POA: Diagnosis present

## 2019-04-19 DIAGNOSIS — S82141S Displaced bicondylar fracture of right tibia, sequela: Secondary | ICD-10-CM | POA: Diagnosis not present

## 2019-04-19 DIAGNOSIS — R402252 Coma scale, best verbal response, oriented, at arrival to emergency department: Secondary | ICD-10-CM | POA: Diagnosis present

## 2019-04-19 DIAGNOSIS — Y92410 Unspecified street and highway as the place of occurrence of the external cause: Secondary | ICD-10-CM | POA: Diagnosis not present

## 2019-04-19 DIAGNOSIS — Z20828 Contact with and (suspected) exposure to other viral communicable diseases: Secondary | ICD-10-CM | POA: Diagnosis present

## 2019-04-19 DIAGNOSIS — D62 Acute posthemorrhagic anemia: Secondary | ICD-10-CM | POA: Diagnosis not present

## 2019-04-19 DIAGNOSIS — S0285XA Fracture of orbit, unspecified, initial encounter for closed fracture: Secondary | ICD-10-CM | POA: Diagnosis present

## 2019-04-19 DIAGNOSIS — T07XXXA Unspecified multiple injuries, initial encounter: Secondary | ICD-10-CM | POA: Diagnosis not present

## 2019-04-19 DIAGNOSIS — S0121XA Laceration without foreign body of nose, initial encounter: Secondary | ICD-10-CM | POA: Diagnosis present

## 2019-04-19 DIAGNOSIS — I10 Essential (primary) hypertension: Secondary | ICD-10-CM | POA: Diagnosis present

## 2019-04-19 DIAGNOSIS — S0530XA Ocular laceration without prolapse or loss of intraocular tissue, unspecified eye, initial encounter: Secondary | ICD-10-CM | POA: Diagnosis present

## 2019-04-19 DIAGNOSIS — K5903 Drug induced constipation: Secondary | ICD-10-CM | POA: Diagnosis not present

## 2019-04-19 DIAGNOSIS — R402362 Coma scale, best motor response, obeys commands, at arrival to emergency department: Secondary | ICD-10-CM | POA: Diagnosis present

## 2019-04-19 DIAGNOSIS — S0219XA Other fracture of base of skull, initial encounter for closed fracture: Secondary | ICD-10-CM | POA: Diagnosis present

## 2019-04-19 DIAGNOSIS — S0591XA Unspecified injury of right eye and orbit, initial encounter: Secondary | ICD-10-CM | POA: Diagnosis not present

## 2019-04-19 DIAGNOSIS — F4323 Adjustment disorder with mixed anxiety and depressed mood: Secondary | ICD-10-CM | POA: Diagnosis not present

## 2019-04-19 DIAGNOSIS — H539 Unspecified visual disturbance: Secondary | ICD-10-CM | POA: Diagnosis not present

## 2019-04-19 DIAGNOSIS — R402142 Coma scale, eyes open, spontaneous, at arrival to emergency department: Secondary | ICD-10-CM | POA: Diagnosis present

## 2019-04-19 DIAGNOSIS — H1131 Conjunctival hemorrhage, right eye: Secondary | ICD-10-CM | POA: Diagnosis present

## 2019-04-19 DIAGNOSIS — S058X1A Other injuries of right eye and orbit, initial encounter: Secondary | ICD-10-CM | POA: Diagnosis present

## 2019-04-19 DIAGNOSIS — K5900 Constipation, unspecified: Secondary | ICD-10-CM | POA: Diagnosis present

## 2019-04-19 DIAGNOSIS — R7303 Prediabetes: Secondary | ICD-10-CM | POA: Diagnosis not present

## 2019-04-19 DIAGNOSIS — R52 Pain, unspecified: Secondary | ICD-10-CM | POA: Diagnosis present

## 2019-04-19 DIAGNOSIS — H4311 Vitreous hemorrhage, right eye: Secondary | ICD-10-CM | POA: Diagnosis present

## 2019-04-19 LAB — CBC
HCT: 41.5 % (ref 39.0–52.0)
Hemoglobin: 14.1 g/dL (ref 13.0–17.0)
MCH: 29.4 pg (ref 26.0–34.0)
MCHC: 34 g/dL (ref 30.0–36.0)
MCV: 86.5 fL (ref 80.0–100.0)
Platelets: 160 10*3/uL (ref 150–400)
RBC: 4.8 MIL/uL (ref 4.22–5.81)
RDW: 13 % (ref 11.5–15.5)
WBC: 13.4 10*3/uL — ABNORMAL HIGH (ref 4.0–10.5)
nRBC: 0 % (ref 0.0–0.2)

## 2019-04-19 LAB — COMPREHENSIVE METABOLIC PANEL
ALT: 51 U/L — ABNORMAL HIGH (ref 0–44)
AST: 62 U/L — ABNORMAL HIGH (ref 15–41)
Albumin: 3.4 g/dL — ABNORMAL LOW (ref 3.5–5.0)
Alkaline Phosphatase: 61 U/L (ref 38–126)
Anion gap: 11 (ref 5–15)
BUN: 9 mg/dL (ref 6–20)
CO2: 24 mmol/L (ref 22–32)
Calcium: 8.4 mg/dL — ABNORMAL LOW (ref 8.9–10.3)
Chloride: 104 mmol/L (ref 98–111)
Creatinine, Ser: 1.16 mg/dL (ref 0.61–1.24)
GFR calc Af Amer: >60 mL/min (ref >60)
GFR calc non Af Amer: >60 mL/min (ref >60)
Glucose, Bld: 171 mg/dL — ABNORMAL HIGH (ref 70–99)
Potassium: 4.1 mmol/L (ref 3.5–5.1)
Sodium: 139 mmol/L (ref 135–145)
Total Bilirubin: 0.4 mg/dL (ref 0.3–1.2)
Total Protein: 6.1 g/dL — ABNORMAL LOW (ref 6.5–8.1)

## 2019-04-19 LAB — URINALYSIS, ROUTINE W REFLEX MICROSCOPIC
Bilirubin Urine: NEGATIVE
Glucose, UA: NEGATIVE mg/dL
Ketones, ur: NEGATIVE mg/dL
Nitrite: NEGATIVE
Protein, ur: NEGATIVE mg/dL
Specific Gravity, Urine: 1.024 (ref 1.005–1.030)
pH: 5 (ref 5.0–8.0)

## 2019-04-19 LAB — LACTIC ACID, PLASMA: Lactic Acid, Venous: 3.9 mmol/L (ref 0.5–1.9)

## 2019-04-19 LAB — HIV ANTIBODY (ROUTINE TESTING W REFLEX): HIV Screen 4th Generation wRfx: NONREACTIVE

## 2019-04-19 MED ORDER — HYDROMORPHONE 1 MG/ML IV SOLN
INTRAVENOUS | Status: AC
  Filled 2019-04-19: qty 30

## 2019-04-19 MED ORDER — HYDROMORPHONE HCL 1 MG/ML IJ SOLN: 1.0000 mg | INTRAMUSCULAR | Status: DC | PRN

## 2019-04-19 MED ORDER — HYDROMORPHONE 1 MG/ML IV SOLN
INTRAVENOUS | Status: DC
  Administered 2019-04-19: 0.6 mg via INTRAVENOUS
  Administered 2019-04-19: 1.5 mg via INTRAVENOUS
  Administered 2019-04-19: 30 mg via INTRAVENOUS

## 2019-04-19 MED ORDER — FENTANYL CITRATE (PF) 100 MCG/2ML IJ SOLN: 25.0000 ug | INTRAMUSCULAR | Status: DC | PRN

## 2019-04-19 MED ORDER — NALOXONE HCL 0.4 MG/ML IJ SOLN: 0.4000 mg | INTRAMUSCULAR | Status: DC | PRN

## 2019-04-19 MED ORDER — OXYCODONE HCL 5 MG/5ML PO SOLN: 5.0000 mg | Freq: Once | ORAL | Status: DC | PRN

## 2019-04-19 MED ORDER — SODIUM CHLORIDE 0.9 % IV SOLN
INTRAVENOUS | Status: DC | PRN
  Administered 2019-04-18: 75 ug/min via INTRAVENOUS

## 2019-04-19 MED ORDER — ENOXAPARIN SODIUM 40 MG/0.4ML ~~LOC~~ SOLN: 40.0000 mg | Freq: Every day | SUBCUTANEOUS | Status: DC

## 2019-04-19 MED ORDER — OXYCODONE HCL 5 MG PO TABS: 5.0000 mg | ORAL_TABLET | ORAL | Status: DC | PRN

## 2019-04-19 MED ORDER — ENOXAPARIN SODIUM 40 MG/0.4ML ~~LOC~~ SOLN
40.0000 mg | Freq: Two times a day (BID) | SUBCUTANEOUS | Status: DC
  Administered 2019-04-19 – 2019-04-27 (×15): 40 mg via SUBCUTANEOUS
  Filled 2019-04-19 (×15): qty 0.4

## 2019-04-19 MED ORDER — SODIUM CHLORIDE 0.9% FLUSH: 9.0000 mL | INTRAVENOUS | Status: DC | PRN

## 2019-04-19 MED ORDER — DEXTROSE-NACL 5-0.9 % IV SOLN
INTRAVENOUS | Status: DC
  Administered 2019-04-19: 03:00:00 via INTRAVENOUS

## 2019-04-19 MED ORDER — ONDANSETRON HCL 4 MG/2ML IJ SOLN: 4.0000 mg | Freq: Four times a day (QID) | INTRAMUSCULAR | Status: DC | PRN

## 2019-04-19 MED ORDER — HYDRALAZINE HCL 20 MG/ML IJ SOLN: 10.0000 mg | INTRAMUSCULAR | Status: DC | PRN

## 2019-04-19 MED ORDER — HYDROMORPHONE 1 MG/ML IV SOLN
INTRAVENOUS | Status: DC
  Administered 2019-04-19: 0.3 mg via INTRAVENOUS
  Administered 2019-04-19: 0.6 mg via INTRAVENOUS
  Administered 2019-04-20: 0.3 mg via INTRAVENOUS

## 2019-04-19 MED ORDER — ACETAMINOPHEN 500 MG PO TABS: 1000.0000 mg | ORAL_TABLET | Freq: Once | ORAL | Status: DC | PRN

## 2019-04-19 MED ORDER — CEFAZOLIN SODIUM-DEXTROSE 1-4 GM/50ML-% IV SOLN
1.0000 g | Freq: Three times a day (TID) | INTRAVENOUS | Status: DC
  Administered 2019-04-19: 1 g via INTRAVENOUS
  Filled 2019-04-19 (×2): qty 50

## 2019-04-19 MED ORDER — SODIUM CHLORIDE 0.9 % IV SOLN
2.0000 g | INTRAVENOUS | Status: DC
  Administered 2019-04-19: 2 g via INTRAVENOUS
  Filled 2019-04-19 (×2): qty 20

## 2019-04-19 MED ORDER — CHLORHEXIDINE GLUCONATE CLOTH 2 % EX PADS
6.0000 | MEDICATED_PAD | Freq: Every day | CUTANEOUS | Status: DC
  Administered 2019-04-20 – 2019-04-27 (×5): 6 via TOPICAL

## 2019-04-19 MED ORDER — OXYCODONE HCL 5 MG/5ML PO SOLN
5.0000 mg | ORAL | Status: DC | PRN
  Administered 2019-04-19: 10 mg via ORAL
  Filled 2019-04-19: qty 10

## 2019-04-19 MED ORDER — OXYCODONE HCL 5 MG PO TABS: 5.0000 mg | ORAL_TABLET | Freq: Once | ORAL | Status: DC | PRN

## 2019-04-19 MED ORDER — ONDANSETRON 4 MG PO TBDP: 4.0000 mg | ORAL_TABLET | Freq: Four times a day (QID) | ORAL | Status: DC | PRN

## 2019-04-19 MED ORDER — DIPHENHYDRAMINE HCL 50 MG/ML IJ SOLN: 12.5000 mg | Freq: Four times a day (QID) | INTRAMUSCULAR | Status: DC | PRN

## 2019-04-19 MED ORDER — LACTATED RINGERS IV BOLUS
2000.0000 mL | Freq: Once | INTRAVENOUS | Status: AC
  Administered 2019-04-19: 2000 mL via INTRAVENOUS

## 2019-04-19 MED ORDER — METHOCARBAMOL 500 MG PO TABS
1000.0000 mg | ORAL_TABLET | Freq: Three times a day (TID) | ORAL | Status: DC
  Administered 2019-04-19 – 2019-04-27 (×21): 1000 mg via ORAL
  Filled 2019-04-19 (×23): qty 2

## 2019-04-19 MED ORDER — ACETAMINOPHEN 10 MG/ML IV SOLN
1000.0000 mg | Freq: Once | INTRAVENOUS | Status: DC | PRN
  Administered 2019-04-19: 1000 mg via INTRAVENOUS

## 2019-04-19 MED ORDER — ACETAMINOPHEN 160 MG/5ML PO SOLN: 1000.0000 mg | Freq: Once | ORAL | Status: DC | PRN

## 2019-04-19 MED ORDER — ONDANSETRON HCL 4 MG/2ML IJ SOLN
INTRAMUSCULAR | Status: DC | PRN
  Administered 2019-04-19: 4 mg via INTRAVENOUS

## 2019-04-19 MED ORDER — DIPHENHYDRAMINE HCL 12.5 MG/5ML PO ELIX: 12.5000 mg | ORAL_SOLUTION | Freq: Four times a day (QID) | ORAL | Status: DC | PRN

## 2019-04-19 MED ORDER — SUGAMMADEX SODIUM 200 MG/2ML IV SOLN
INTRAVENOUS | Status: DC | PRN
  Administered 2019-04-19: 200 mg via INTRAVENOUS

## 2019-04-19 MED ORDER — ACETAMINOPHEN 10 MG/ML IV SOLN
1000.0000 mg | Freq: Four times a day (QID) | INTRAVENOUS | Status: AC
  Administered 2019-04-19 – 2019-04-20 (×4): 1000 mg via INTRAVENOUS
  Filled 2019-04-19 (×4): qty 100

## 2019-04-19 MED ORDER — LACTATED RINGERS IV SOLN
INTRAVENOUS | Status: DC
  Administered 2019-04-19 – 2019-04-25 (×7): via INTRAVENOUS

## 2019-04-19 MED ORDER — ACETAMINOPHEN 10 MG/ML IV SOLN
INTRAVENOUS | Status: AC
  Filled 2019-04-19: qty 100
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
## 2019-04-19 NOTE — Transfer of Care (Signed)
Immediate Anesthesia Transfer of Care Note  Patient: Eric [name]  Procedure(s) Performed: INTRAMEDULLARY (IM) RETROGRADE FEMORAL NAILING (Left Leg Upper) Eye Exam Under Anesthesia (Right Eye)  Patient Location: PACU  Anesthesia Type:General  Level of Consciousness: drowsy  Airway & Oxygen Therapy: Patient Spontanous Breathing and Patient connected to face mask oxygen  Post-op Assessment: Report given to RN, Post -op Vital signs reviewed and stable and Patient moving all extremities  Post vital signs: Reviewed and stable  Last Vitals:  Vitals Value Taken Time  BP 142/85 04/19/19 0030  Temp    Pulse 103 04/19/19 0033  Resp 23 04/19/19 0033  SpO2 100 % 04/19/19 0033  Vitals shown include unvalidated device data.  Last Pain:  Vitals:   04/18/19 1731  TempSrc:   PainSc: 10-Worst pain ever         Complications: No apparent anesthesia complications

## 2019-04-19 NOTE — Progress Notes (Signed)
Orthopedic Tech Progress Note Patient Details:  Eric [name] 1971/06/24 [id number]  Patient ID: Eric [name], male   DOB: [birth year], 48 y.o.   MRN: [id number] I talked to the rn. They confirmed pt had on knee immobilizer.  Karolee Stamps 04/19/2019, 4:12 AM

## 2019-04-19 NOTE — Op Note (Signed)
04/18/2019 - 04/19/2019  12:16 AM  PATIENT:  Eric [name]    PRE-OPERATIVE DIAGNOSIS:  Left Femur Fracture trauma  POST-OPERATIVE DIAGNOSIS: Open type I left distal femur fracture. MCL and ACL instability right knee.  PROCEDURE:  INTRAMEDULLARY (IM) RETROGRADE FEMORAL NAILING Irrigation and debridement of the open fracture of skin soft tissue muscle and bone. C-arm fluoroscopy to perform internal fixation verify alignment.  SURGEON:  Newt Minion, MD  PHYSICIAN ASSISTANT:None ANESTHESIA:   General  PREOPERATIVE INDICATIONS:  Eric [name] is a  48 y.o. male with a diagnosis of Left Femur Fracture trauma who failed conservative measures and elected for surgical management.    The risks benefits and alternatives were discussed with the patient preoperatively including but not limited to the risks of infection, bleeding, nerve injury, cardiopulmonary complications, the need for revision surgery, among others, and the patient was willing to proceed.  OPERATIVE IMPLANTS: Biomet 10.5 x 280 mm nail with 2 [name] distally.  @ENCIMAGES @  OPERATIVE FINDINGS: Unstable left knee with complete disruption of the ACL and MCL ligaments.  Type I open distal femur fracture.  OPERATIVE PROCEDURE: Patient was brought to the operating room and underwent a general anesthetic.  After adequate levels anesthesia were obtained patient was placed supine on the operating table flat Jackson table in the left lower extremity was prepped using DuraPrep draped into a sterile field a timeout was called.  An incision was made through the patella tendon.  The instrument guide was placed the guidewire was then inserted center center in both the AP and lateral planes of the distal femur.  This was then overdrilled the reduction tool was placed and a guidewire was inserted up the canal.  The 10.5 x 280 mm nail was inserted this was locked distally with 2 [name].  C-arm fluoroscopy verified alignment of both AP and lateral  planes.  Patient's rotation was equal to the right lower extremity.  Saline was used to irrigate the open grade 1 laceration.  This wound was 1 mm in diameter.  This was irrigated with normal saline.  Sterile dressing was applied then both legs were checked.  The alignment and external rotation was symmetric.  Examination of the right knee under anesthesia showed complete disruption of the ACL and MCL ligaments of the right knee.  Patient's knee is placed in a knee immobilizer.  Patient remained in surgery for ophthalmologic examination.   DISCHARGE PLANNING:  Antibiotic duration: Continue IV antibiotics for total of 72 hours  Weightbearing: Weightbearing as tolerated on the right with a knee immobilizer nonweightbearing on the left  Pain medication: Opioid pathway ordered  Dressing care/ Wound VAC: Reinforce left knee dressing as needed  Ambulatory devices: Walker and wheelchair  Discharge to: Anticipate discharge to home  Follow-up: In the office 1 week post operative.
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
## 2019-04-19 NOTE — H&P (View-Only) (Signed)
Patient ID: Eric [name], male   DOB: 07/15/1970, 48 y.o.   MRN: [id number] He is more awake and alert today.  He is complaining of pain in the upper jaw and he feels that his teeth are not lined up properly.  Based on these symptoms, recommend I proceed with open reduction internal fixation of right maxillary fracture.  I will try to schedule for Wednesday. 
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
## 2019-04-19 NOTE — Op Note (Signed)
Surgeon: Danice Goltz   Assistants: None  Anesthesia: general anestheisa  ASA Class: 2  Pre-op Diagnosis: Possible Ruptured Globe - Right Eye  Post-op Diagnosis: Subconjunctival hemorrhage - Right Eye  Procedure Performed: Exam Under Anesthesia  EBL: none  Findings: Patient was intubated and sedated by anesthesia. Orthopedic surgery complete left distal femur fracture repair. Once completed exam under anesthesia of both globes was performed. There was no evidence of ruptured globe on the left side. Attention then turned to the right eye which was able to free examine due to patient under sedation. The conjunctiva was intact but with subconjunctival hemorrhage superotemporally. This area was heaped up but with cotton tip applicator was able to push this aside until clear conjunctiva was able to come over this area and was notable for no evidence of occult rupture. The rest of the globe 360 was inspected past the equator through the conjunctival and no occult or posterior laceration was noted. The decision was made not to conclude the case as there was no need for conjunctival peritomy and further exploration as any laceration would be very posteriorly and any attempt to retract the globe and repair would likely cause more harm. The case was ended and patient was extubated per anesthesia.  Specimens: None Drains: None Complications: None  Disposition: To recovery and then trauma surgery service  Post-op care: no need for antibiotic or post op drops  Christopher T. Manuella Ghazi, MD

## 2019-04-19 NOTE — Progress Notes (Signed)
Patient ID: Eric [name], male   DOB: 1970-08-17, 48 y.o.   MRN: [id number] He is more awake and alert today.  He is complaining of pain in the upper jaw and he feels that his teeth are not lined up properly.  Based on these symptoms, recommend I proceed with open reduction internal fixation of right maxillary fracture.  I will try to schedule for Wednesday.

## 2019-04-20 LAB — BASIC METABOLIC PANEL
Anion gap: 8 (ref 5–15)
BUN: 11 mg/dL (ref 6–20)
CO2: 26 mmol/L (ref 22–32)
Calcium: 8.3 mg/dL — ABNORMAL LOW (ref 8.9–10.3)
Chloride: 106 mmol/L (ref 98–111)
Creatinine, Ser: 3.41 mg/dL — ABNORMAL HIGH (ref 0.61–1.24)
GFR calc Af Amer: 23 mL/min — ABNORMAL LOW (ref >60)
GFR calc non Af Amer: 20 mL/min — ABNORMAL LOW (ref >60)
Glucose, Bld: 102 mg/dL — ABNORMAL HIGH (ref 70–99)
Potassium: 3.7 mmol/L (ref 3.5–5.1)
Sodium: 140 mmol/L (ref 135–145)

## 2019-04-20 LAB — CBC
HCT: 35.7 % — ABNORMAL LOW (ref 39.0–52.0)
Hemoglobin: 12 g/dL — ABNORMAL LOW (ref 13.0–17.0)
MCH: 29.5 pg (ref 26.0–34.0)
MCHC: 33.6 g/dL (ref 30.0–36.0)
MCV: 87.7 fL (ref 80.0–100.0)
Platelets: 115 10*3/uL — ABNORMAL LOW (ref 150–400)
RBC: 4.07 MIL/uL — ABNORMAL LOW (ref 4.22–5.81)
RDW: 13.3 % (ref 11.5–15.5)
WBC: 8.9 10*3/uL (ref 4.0–10.5)
nRBC: 0 % (ref 0.0–0.2)

## 2019-04-20 LAB — MAGNESIUM: Magnesium: 1.9 mg/dL (ref 1.7–2.4)

## 2019-04-20 LAB — PHOSPHORUS: Phosphorus: 2.5 mg/dL (ref 2.5–4.6)

## 2019-04-20 MED ORDER — MORPHINE SULFATE (PF) 4 MG/ML IV SOLN
4.0000 mg | INTRAVENOUS | Status: DC | PRN
  Administered 2019-04-20 (×2): 4 mg via INTRAVENOUS
  Filled 2019-04-20 (×2): qty 1

## 2019-04-20 MED ORDER — OXYCODONE HCL 5 MG/5ML PO SOLN
5.0000 mg | ORAL | Status: DC | PRN
  Administered 2019-04-20: 5 mg via ORAL
  Administered 2019-04-21 – 2019-04-23 (×5): 10 mg via ORAL
  Filled 2019-04-20 (×2): qty 10
  Filled 2019-04-20: qty 5
  Filled 2019-04-20 (×4): qty 10

## 2019-04-20 MED ORDER — ENSURE PRE-SURGERY PO LIQD
296.0000 mL | Freq: Once | ORAL | Status: AC
  Administered 2019-04-20: 296 mL via ORAL
  Filled 2019-04-20: qty 296

## 2019-04-20 MED ORDER — DOCUSATE SODIUM 100 MG PO CAPS
100.0000 mg | ORAL_CAPSULE | Freq: Two times a day (BID) | ORAL | Status: DC
  Administered 2019-04-20 – 2019-04-27 (×13): 100 mg via ORAL
  Filled 2019-04-20 (×13): qty 1

## 2019-04-20 MED ORDER — ACETAMINOPHEN 325 MG PO TABS
650.0000 mg | ORAL_TABLET | Freq: Four times a day (QID) | ORAL | Status: DC | PRN
  Administered 2019-04-20: 650 mg via ORAL
  Filled 2019-04-20: qty 2

## 2019-04-20 MED ORDER — MAGNESIUM SULFATE 2 GM/50ML IV SOLN
2.0000 g | Freq: Once | INTRAVENOUS | Status: AC
  Administered 2019-04-20: 2 g via INTRAVENOUS
  Filled 2019-04-20: qty 50

## 2019-04-20 MED ORDER — CEFAZOLIN SODIUM-DEXTROSE 2-4 GM/100ML-% IV SOLN
2.0000 g | Freq: Three times a day (TID) | INTRAVENOUS | Status: DC
  Administered 2019-04-20 – 2019-04-21 (×4): 2 g via INTRAVENOUS
  Filled 2019-04-20 (×6): qty 100

## 2019-04-20 MED ORDER — CHLORHEXIDINE GLUCONATE 4 % EX LIQD
60.0000 mL | Freq: Once | CUTANEOUS | Status: AC
  Administered 2019-04-21: 4 via TOPICAL
  Filled 2019-04-20 (×2): qty 60

## 2019-04-20 MED ORDER — POVIDONE-IODINE 10 % EX SWAB: 2.0000 "application " | Freq: Once | CUTANEOUS | Status: DC

## 2019-04-20 NOTE — Progress Notes (Signed)
Rehab Admissions Coordinator Note:  Patient was screened by Michel Santee for appropriateness for an Inpatient Acute Rehab Consult.  At this time, we are recommending Inpatient Rehab consult.  Michel Santee 04/20/2019, 2:32 PM  I can be reached at [phone].
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
## 2019-04-21 ENCOUNTER — Encounter (HOSPITAL_COMMUNITY): Payer: Self-pay | Admitting: Orthopedic Surgery

## 2019-04-21 ENCOUNTER — Inpatient Hospital Stay (HOSPITAL_COMMUNITY): Payer: Managed Care, Other (non HMO) | Admitting: Certified Registered Nurse Anesthetist

## 2019-04-21 ENCOUNTER — Inpatient Hospital Stay (HOSPITAL_COMMUNITY): Payer: Managed Care, Other (non HMO)

## 2019-04-21 ENCOUNTER — Encounter (HOSPITAL_COMMUNITY): Admission: EM | Disposition: A | Discharge status: Rehabilitation Facility | Payer: Self-pay | Source: Home / Self Care

## 2019-04-21 DIAGNOSIS — S72402B Unspecified fracture of lower end of left femur, initial encounter for open fracture type I or II: Secondary | ICD-10-CM | POA: Diagnosis not present

## 2019-04-21 HISTORY — PX: I & D EXTREMITY: SHX5045

## 2019-04-21 HISTORY — PX: FEMUR IM NAIL: SHX1597

## 2019-04-21 LAB — CBC
HCT: 33.1 % — ABNORMAL LOW (ref 39.0–52.0)
Hemoglobin: 10.9 g/dL — ABNORMAL LOW (ref 13.0–17.0)
MCH: 29 pg (ref 26.0–34.0)
MCHC: 32.9 g/dL (ref 30.0–36.0)
MCV: 88 fL (ref 80.0–100.0)
Platelets: 131 10*3/uL — ABNORMAL LOW (ref 150–400)
RBC: 3.76 MIL/uL — ABNORMAL LOW (ref 4.22–5.81)
RDW: 13.2 % (ref 11.5–15.5)
WBC: 8.8 10*3/uL (ref 4.0–10.5)
nRBC: 0 % (ref 0.0–0.2)

## 2019-04-21 LAB — BASIC METABOLIC PANEL
Anion gap: 7 (ref 5–15)
BUN: 10 mg/dL (ref 6–20)
CO2: 26 mmol/L (ref 22–32)
Calcium: 8.1 mg/dL — ABNORMAL LOW (ref 8.9–10.3)
Chloride: 103 mmol/L (ref 98–111)
Creatinine, Ser: 0.95 mg/dL (ref 0.61–1.24)
GFR calc Af Amer: >60 mL/min (ref >60)
GFR calc non Af Amer: >60 mL/min (ref >60)
Glucose, Bld: 121 mg/dL — ABNORMAL HIGH (ref 70–99)
Potassium: 3.7 mmol/L (ref 3.5–5.1)
Sodium: 136 mmol/L (ref 135–145)

## 2019-04-21 LAB — MAGNESIUM: Magnesium: 2 mg/dL (ref 1.7–2.4)

## 2019-04-21 LAB — PHOSPHORUS: Phosphorus: 2.3 mg/dL — ABNORMAL LOW (ref 2.5–4.6)

## 2019-04-21 IMAGING — RF DG FEMUR 2+V*L*
1 series · 2 of 2 positions shown · non-contrast
Comparison: [DATE].

CLINICAL DATA: Elective surgery.

EXAM:
LEFT FEMUR 2 VIEWS; DG C-ARM 1-60 MIN
FLUOROSCOPY TIME:  27 seconds.

[Series 1: run · 2 of 2 slices shown]
[im 1/2]
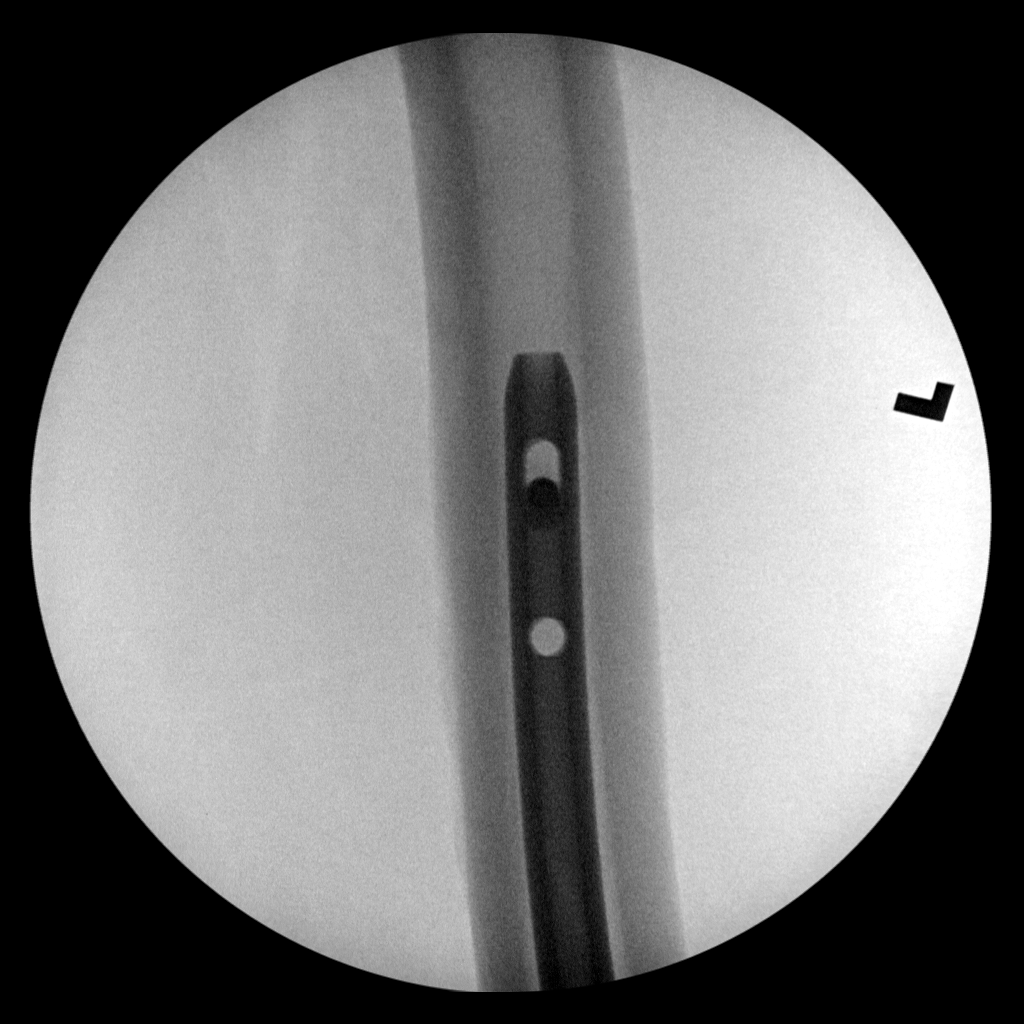
[im 2/2]
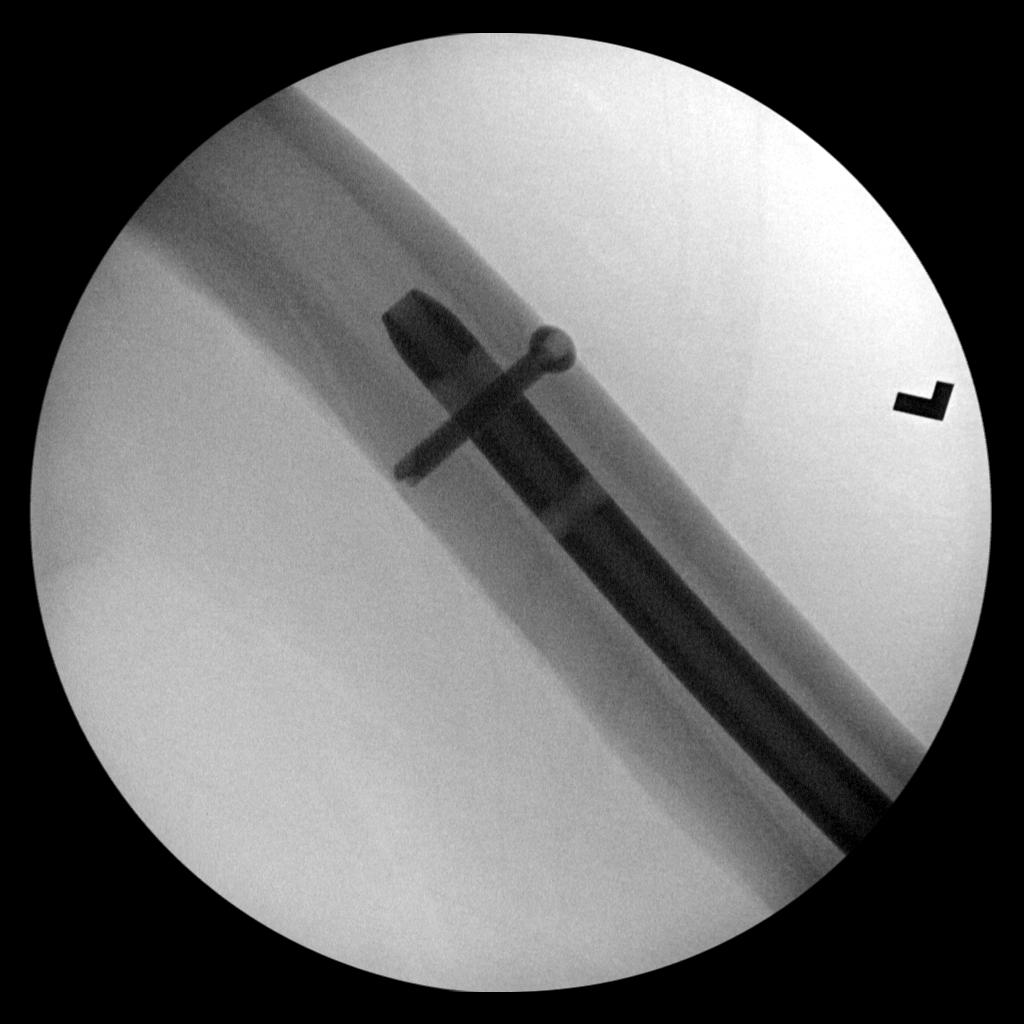

[2 of 2 positions shown; findings below may reference images not displayed]

FINDINGS: Two intraoperative fluoroscopic images were obtained of the left
femur. These images demonstrate what appears to be the distal end of
intramedullary rod.
IMPRESSION: Fluoroscopic guidance provided during femur surgery.

## 2019-04-21 IMAGING — RF DG C-ARM 1-60 MIN
1 series · 2 of 2 positions shown · non-contrast
Comparison: [DATE].

CLINICAL DATA: Elective surgery.

EXAM:
LEFT FEMUR 2 VIEWS; DG C-ARM 1-60 MIN
FLUOROSCOPY TIME:  27 seconds.

[Series 1: run · 2 of 2 slices shown]
[im 1/2]
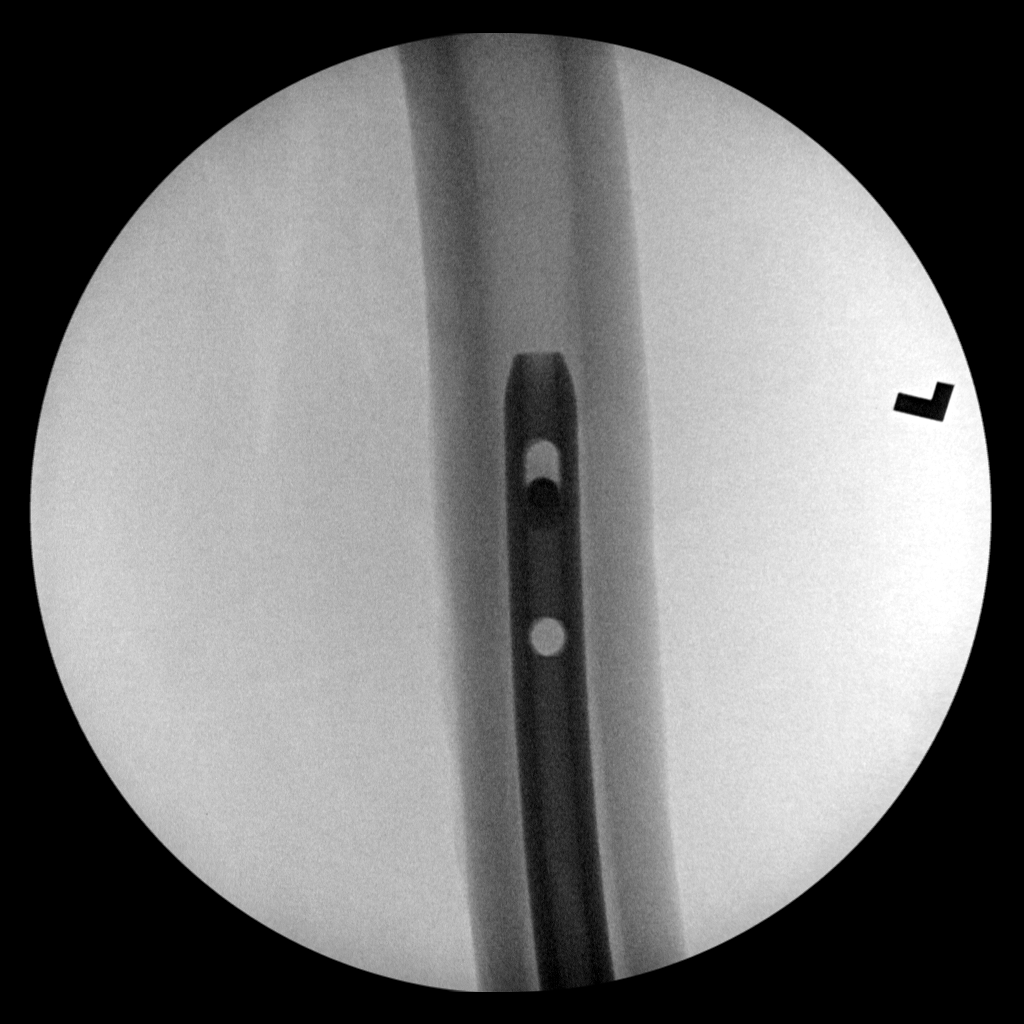
[im 2/2]
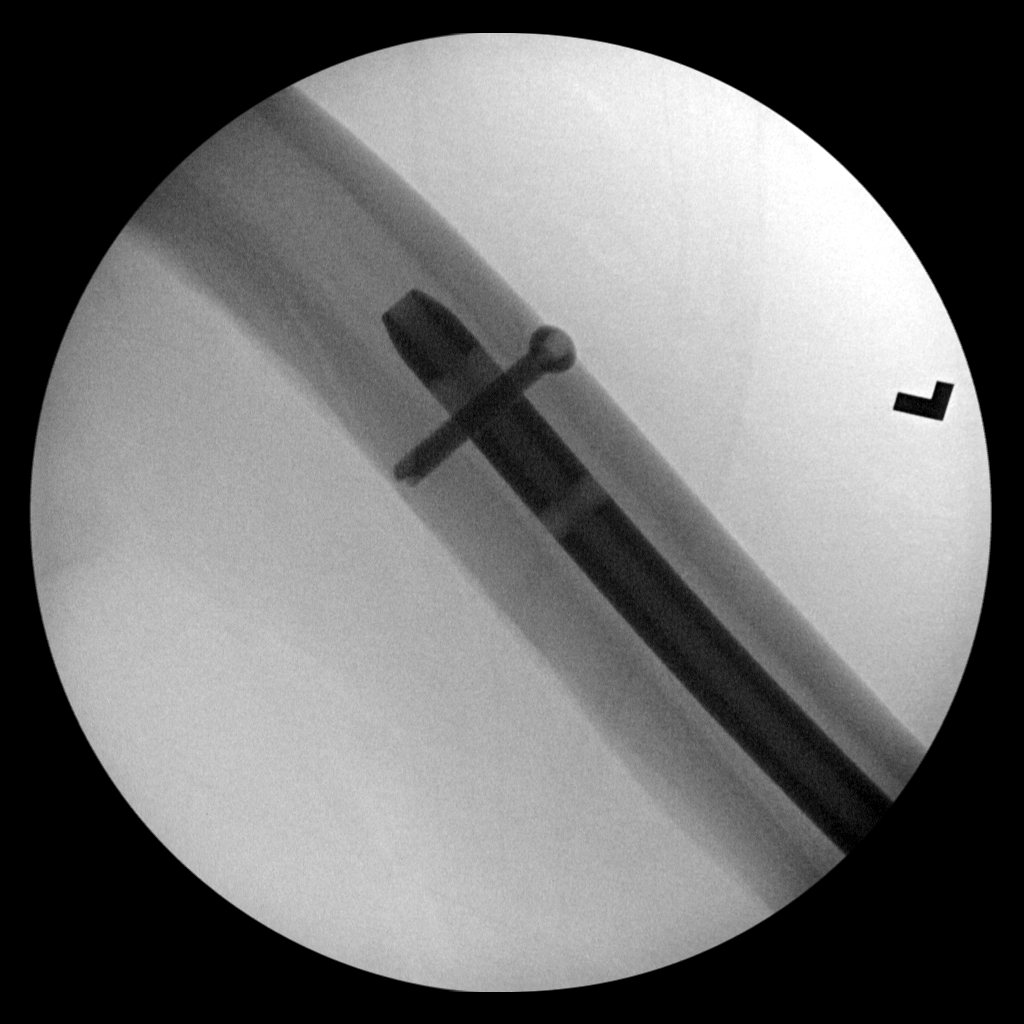

[2 of 2 positions shown; findings below may reference images not displayed]

FINDINGS: Two intraoperative fluoroscopic images were obtained of the left
femur. These images demonstrate what appears to be the distal end of
intramedullary rod.
IMPRESSION: Fluoroscopic guidance provided during femur surgery.

## 2019-04-21 SURGERY — INSERTION, INTRAMEDULLARY ROD, FEMUR
Anesthesia: General | Site: Leg Upper | Laterality: Left

## 2019-04-21 MED ORDER — PROPOFOL 10 MG/ML IV BOLUS
INTRAVENOUS | Status: DC | PRN
  Administered 2019-04-21: 180 mg via INTRAVENOUS

## 2019-04-21 MED ORDER — METHOCARBAMOL 1000 MG/10ML IJ SOLN
500.0000 mg | Freq: Four times a day (QID) | INTRAVENOUS | Status: DC | PRN
  Filled 2019-04-21: qty 5

## 2019-04-21 MED ORDER — DEXAMETHASONE SODIUM PHOSPHATE 10 MG/ML IJ SOLN
INTRAMUSCULAR | Status: DC | PRN
  Administered 2019-04-21: 5 mg via INTRAVENOUS

## 2019-04-21 MED ORDER — 0.9 % SODIUM CHLORIDE (POUR BTL) OPTIME
TOPICAL | Status: DC | PRN
  Administered 2019-04-21: 1000 mL

## 2019-04-21 MED ORDER — LIDOCAINE 2% (20 MG/ML) 5 ML SYRINGE
INTRAMUSCULAR | Status: DC | PRN
  Administered 2019-04-21: 60 mg via INTRAVENOUS

## 2019-04-21 MED ORDER — SUGAMMADEX SODIUM 200 MG/2ML IV SOLN
INTRAVENOUS | Status: DC | PRN
  Administered 2019-04-21: 300 mg via INTRAVENOUS

## 2019-04-21 MED ORDER — ACETAMINOPHEN 500 MG PO TABS: 1000.0000 mg | ORAL_TABLET | Freq: Once | ORAL | Status: DC | PRN

## 2019-04-21 MED ORDER — ONDANSETRON HCL 4 MG/2ML IJ SOLN: 4.0000 mg | Freq: Four times a day (QID) | INTRAMUSCULAR | Status: DC | PRN

## 2019-04-21 MED ORDER — OXYCODONE HCL 5 MG/5ML PO SOLN: 5.0000 mg | Freq: Once | ORAL | Status: DC | PRN

## 2019-04-21 MED ORDER — OXYMETAZOLINE HCL 0.05 % NA SOLN
2.0000 | NASAL | Status: DC
  Administered 2019-04-22: 2 via NASAL
  Filled 2019-04-21 (×2): qty 30

## 2019-04-21 MED ORDER — SUCCINYLCHOLINE CHLORIDE 20 MG/ML IJ SOLN
INTRAMUSCULAR | Status: DC | PRN
  Administered 2019-04-21: 140 mg via INTRAVENOUS

## 2019-04-21 MED ORDER — LACTATED RINGERS IV SOLN
INTRAVENOUS | Status: DC | PRN
  Administered 2019-04-21: 11:00:00 via INTRAVENOUS

## 2019-04-21 MED ORDER — OXYCODONE HCL 5 MG PO TABS
5.0000 mg | ORAL_TABLET | ORAL | Status: DC | PRN
  Filled 2019-04-21: qty 2

## 2019-04-21 MED ORDER — ROCURONIUM BROMIDE 100 MG/10ML IV SOLN
INTRAVENOUS | Status: DC | PRN
  Administered 2019-04-21: 50 mg via INTRAVENOUS

## 2019-04-21 MED ORDER — BISACODYL 10 MG RE SUPP: 10.0000 mg | Freq: Every day | RECTAL | Status: DC | PRN

## 2019-04-21 MED ORDER — METOCLOPRAMIDE HCL 10 MG PO TABS: 5.0000 mg | ORAL_TABLET | Freq: Three times a day (TID) | ORAL | Status: DC | PRN

## 2019-04-21 MED ORDER — MIDAZOLAM HCL 2 MG/2ML IJ SOLN
INTRAMUSCULAR | Status: AC
  Filled 2019-04-21: qty 2

## 2019-04-21 MED ORDER — HYDROMORPHONE HCL 1 MG/ML IJ SOLN
0.5000 mg | INTRAMUSCULAR | Status: DC | PRN
  Administered 2019-04-22 – 2019-04-26 (×3): 1 mg via INTRAVENOUS
  Filled 2019-04-21 (×3): qty 1

## 2019-04-21 MED ORDER — OXYCODONE HCL 5 MG PO TABS: 5.0000 mg | ORAL_TABLET | Freq: Once | ORAL | Status: DC | PRN

## 2019-04-21 MED ORDER — ONDANSETRON HCL 4 MG/2ML IJ SOLN
INTRAMUSCULAR | Status: DC | PRN
  Administered 2019-04-21: 4 mg via INTRAVENOUS

## 2019-04-21 MED ORDER — POLYETHYLENE GLYCOL 3350 17 G PO PACK
17.0000 g | PACK | Freq: Every day | ORAL | Status: DC | PRN
  Filled 2019-04-21: qty 1

## 2019-04-21 MED ORDER — ACETAMINOPHEN 325 MG PO TABS: 325.0000 mg | ORAL_TABLET | Freq: Four times a day (QID) | ORAL | Status: DC | PRN

## 2019-04-21 MED ORDER — FENTANYL CITRATE (PF) 100 MCG/2ML IJ SOLN
25.0000 ug | INTRAMUSCULAR | Status: DC | PRN
  Administered 2019-04-21: 13:00:00 50 ug via INTRAVENOUS

## 2019-04-21 MED ORDER — ACETAMINOPHEN 10 MG/ML IV SOLN: 1000.0000 mg | Freq: Once | INTRAVENOUS | Status: DC | PRN

## 2019-04-21 MED ORDER — ACETAMINOPHEN 160 MG/5ML PO SOLN: 1000.0000 mg | Freq: Once | ORAL | Status: DC | PRN

## 2019-04-21 MED ORDER — LACTATED RINGERS IV SOLN
INTRAVENOUS | Status: DC
  Administered 2019-04-21: 10:00:00 via INTRAVENOUS

## 2019-04-21 MED ORDER — DOCUSATE SODIUM 100 MG PO CAPS: 100.0000 mg | ORAL_CAPSULE | Freq: Two times a day (BID) | ORAL | Status: DC

## 2019-04-21 MED ORDER — SODIUM CHLORIDE 0.9 % IV SOLN
INTRAVENOUS | Status: DC
  Administered 2019-04-21: 15:00:00 via INTRAVENOUS

## 2019-04-21 MED ORDER — ONDANSETRON HCL 4 MG PO TABS: 4.0000 mg | ORAL_TABLET | Freq: Four times a day (QID) | ORAL | Status: DC | PRN

## 2019-04-21 MED ORDER — POLYETHYLENE GLYCOL 3350 17 G PO PACK
17.0000 g | PACK | Freq: Every day | ORAL | Status: DC
  Administered 2019-04-21 – 2019-04-27 (×5): 17 g via ORAL
  Filled 2019-04-21 (×5): qty 1

## 2019-04-21 MED ORDER — FENTANYL CITRATE (PF) 250 MCG/5ML IJ SOLN
INTRAMUSCULAR | Status: AC
  Filled 2019-04-21: qty 5

## 2019-04-21 MED ORDER — MAGNESIUM CITRATE PO SOLN: 1.0000 | Freq: Once | ORAL | Status: DC | PRN

## 2019-04-21 MED ORDER — OXYCODONE HCL 5 MG PO TABS
10.0000 mg | ORAL_TABLET | ORAL | Status: DC | PRN
  Administered 2019-04-21 – 2019-04-22 (×2): 10 mg via ORAL
  Filled 2019-04-21: qty 2

## 2019-04-21 MED ORDER — CEFAZOLIN SODIUM-DEXTROSE 2-4 GM/100ML-% IV SOLN
2.0000 g | Freq: Four times a day (QID) | INTRAVENOUS | Status: DC
  Administered 2019-04-21: 2 g via INTRAVENOUS
  Filled 2019-04-21 (×3): qty 100

## 2019-04-21 MED ORDER — METHOCARBAMOL 500 MG PO TABS: 500.0000 mg | ORAL_TABLET | Freq: Four times a day (QID) | ORAL | Status: DC | PRN

## 2019-04-21 MED ORDER — METOCLOPRAMIDE HCL 5 MG/ML IJ SOLN: 5.0000 mg | Freq: Three times a day (TID) | INTRAMUSCULAR | Status: DC | PRN

## 2019-04-21 MED ORDER — FENTANYL CITRATE (PF) 100 MCG/2ML IJ SOLN
INTRAMUSCULAR | Status: AC
  Administered 2019-04-21: 50 ug via INTRAVENOUS
  Filled 2019-04-21: qty 2

## 2019-04-21 MED ORDER — FENTANYL CITRATE (PF) 250 MCG/5ML IJ SOLN
INTRAMUSCULAR | Status: DC | PRN
  Administered 2019-04-21 (×2): 50 ug via INTRAVENOUS
  Administered 2019-04-21: 100 ug via INTRAVENOUS
  Administered 2019-04-21: 50 ug via INTRAVENOUS

## 2019-04-21 SURGICAL SUPPLY — 54 items
BIT DRILL CROWE PNT TWST 4.5MM (DRILL) IMPLANT
BLADE SURG 15 STRL LF DISP TIS (BLADE) ×1 IMPLANT
BLADE SURG 15 STRL SS (BLADE) ×2
BLADE SURG 21 STRL SS (BLADE) ×3 IMPLANT
BNDG COHESIVE 6X5 TAN STRL LF (GAUZE/BANDAGES/DRESSINGS) ×2 IMPLANT
BNDG GAUZE ELAST 4 BULKY (GAUZE/BANDAGES/DRESSINGS) ×6 IMPLANT
COVER BACK TABLE 60X90IN (DRAPES) ×1 IMPLANT
COVER PERINEAL POST (MISCELLANEOUS) ×1 IMPLANT
COVER SURGICAL LIGHT HANDLE (MISCELLANEOUS) ×6 IMPLANT
COVER WAND RF STERILE (DRAPES) ×3 IMPLANT
DRAPE C-ARM 42X72 X-RAY (DRAPES) ×3 IMPLANT
DRAPE STERI IOBAN 125X83 (DRAPES) ×1 IMPLANT
DRAPE U-SHAPE 47X51 STRL (DRAPES) ×3 IMPLANT
DRILL CROWE POINT TWIST 4.5MM (DRILL) ×3
DRSG ADAPTIC 3X8 NADH LF (GAUZE/BANDAGES/DRESSINGS) ×3 IMPLANT
DRSG MEPILEX BORDER 4X4 (GAUZE/BANDAGES/DRESSINGS) ×3 IMPLANT
DRSG MEPILEX BORDER 4X8 (GAUZE/BANDAGES/DRESSINGS) ×3 IMPLANT
DRSG PAD ABDOMINAL 8X10 ST (GAUZE/BANDAGES/DRESSINGS) ×4 IMPLANT
DURAPREP 26ML APPLICATOR (WOUND CARE) ×3 IMPLANT
ELECT REM PT RETURN 9FT ADLT (ELECTROSURGICAL) ×3
ELECTRODE REM PT RTRN 9FT ADLT (ELECTROSURGICAL) ×1 IMPLANT
EVACUATOR 1/8 PVC DRAIN (DRAIN) IMPLANT
GAUZE SPONGE 4X4 12PLY STRL (GAUZE/BANDAGES/DRESSINGS) ×3 IMPLANT
GAUZE SPONGE 4X4 12PLY STRL LF (GAUZE/BANDAGES/DRESSINGS) ×2 IMPLANT
GLOVE BIOGEL PI IND STRL 9 (GLOVE) ×1 IMPLANT
GLOVE BIOGEL PI INDICATOR 9 (GLOVE) ×2
GLOVE SURG ORTHO 9.0 STRL STRW (GLOVE) ×3 IMPLANT
GOWN STRL REUS W/ TWL XL LVL3 (GOWN DISPOSABLE) ×3 IMPLANT
GOWN STRL REUS W/TWL XL LVL3 (GOWN DISPOSABLE) ×4
GUIDEPIN 3.2X17.5 THRD DISP (PIN) ×2 IMPLANT
HANDPIECE INTERPULSE COAX TIP (DISPOSABLE)
KIT BASIN OR (CUSTOM PROCEDURE TRAY) ×3 IMPLANT
KIT TURNOVER KIT B (KITS) ×3 IMPLANT
MANIFOLD NEPTUNE II (INSTRUMENTS) ×1 IMPLANT
NS IRRIG 1000ML POUR BTL (IV SOLUTION) ×3 IMPLANT
PACK GENERAL/GYN (CUSTOM PROCEDURE TRAY) ×3 IMPLANT
PACK ORTHO EXTREMITY (CUSTOM PROCEDURE TRAY) ×1 IMPLANT
PAD ARMBOARD 7.5X6 YLW CONV (MISCELLANEOUS) ×6 IMPLANT
PAD CAST 4YDX4 CTTN HI CHSV (CAST SUPPLIES) IMPLANT
PADDING CAST COTTON 4X4 STRL (CAST SUPPLIES) ×2
PADDING CAST COTTON 6X4 STRL (CAST SUPPLIES) ×2 IMPLANT
SCREW CORT TI DBL LEAD 5X36 (Screw) ×4 IMPLANT
SET HNDPC FAN SPRY TIP SCT (DISPOSABLE) IMPLANT
STAPLER VISISTAT 35W (STAPLE) IMPLANT
STOCKINETTE IMPERVIOUS 9X36 MD (GAUZE/BANDAGES/DRESSINGS) IMPLANT
SUT ETHILON 2 0 PSLX (SUTURE) ×6 IMPLANT
SUT VIC AB 2-0 CTB1 (SUTURE) IMPLANT
SWAB COLLECTION DEVICE MRSA (MISCELLANEOUS) ×1 IMPLANT
SWAB CULTURE ESWAB REG 1ML (MISCELLANEOUS) IMPLANT
TOWEL GREEN STERILE (TOWEL DISPOSABLE) ×3 IMPLANT
TUBE CONNECTING 12'X1/4 (SUCTIONS)
TUBE CONNECTING 12X1/4 (SUCTIONS) ×1 IMPLANT
WATER STERILE IRR 1000ML POUR (IV SOLUTION) ×4 IMPLANT
YANKAUER SUCT BULB TIP NO VENT (SUCTIONS) ×3 IMPLANT

## 2019-04-21 NOTE — Anesthesia Procedure Notes (Signed)
Procedure Name: Intubation Date/Time: 04/21/2019 11:31 AM Performed by: Janene Harvey, CRNA Pre-anesthesia Checklist: Patient identified, Emergency Drugs available, Suction available and Patient being monitored Patient Re-evaluated:Patient Re-evaluated prior to induction Oxygen Delivery Method: Circle system utilized Preoxygenation: Pre-oxygenation with 100% oxygen Induction Type: IV induction and Rapid sequence Laryngoscope Size: Glidescope and 3 Grade View: Grade I Tube type: Oral Tube size: 7.5 mm Number of attempts: 1 Airway Equipment and Method: Stylet and Oral airway Placement Confirmation: ETT inserted through vocal cords under direct vision,  positive ETCO2 and breath sounds checked- equal and bilateral Secured at: 23 cm Tube secured with: Tape Dental Injury: Teeth and Oropharynx as per pre-operative assessment
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
## 2019-04-21 NOTE — Progress Notes (Signed)
SLP Cancellation Note  Patient Details Name: Eric [name] MRN: [id number] DOB: 08/17/1970   Cancelled treatment:       Reason Eval/Treat Not Completed: Medical issues which prohibited therapy   Pt in surgery, will continue to follow for services   Rhemi Balbach 04/21/2019, 3:57 PM

## 2019-04-21 NOTE — Op Note (Signed)
04/21/2019  12:38 PM  PATIENT:  Eric [name]    PRE-OPERATIVE DIAGNOSIS:  open left femur fracture  POST-OPERATIVE DIAGNOSIS:  Same  PROCEDURE:  OPEN INTRAMEDULLARY (IM) NAIL FEMORAL locking proximally x1., IRRIGATION AND DEBRIDEMENT EXTREMITY, with excision of skin soft tissue muscle and bone. C arm fluoroscopy.  SURGEON:  Newt Minion, MD  PHYSICIAN ASSISTANT:None ANESTHESIA:   General  PREOPERATIVE INDICATIONS:  Eric [name] is a  48 y.o. male with a diagnosis of open left femur fracture who failed conservative measures and elected for surgical management.    The risks benefits and alternatives were discussed with the patient preoperatively including but not limited to the risks of infection, bleeding, nerve injury, cardiopulmonary complications, the need for revision surgery, among others, and the patient was willing to proceed.  OPERATIVE IMPLANTS: 46 mm locking screw proximal intramedullary nail.  @ENCIMAGES @  OPERATIVE FINDINGS: Patient had a large area of ischemic muscle that was excised as well as a loose piece of bone from the femur which was 2 cm in diameter.  OPERATIVE PROCEDURE: Patient was brought the operating room and underwent a general anesthetic.  After adequate levels anesthesia were obtained patient's left lower extremity was prepped using DuraPrep draped into a sterile field a timeout was called.  A football shaped incision was made longitudinally over the puncture wounds from the open femur fracture.  The skin and soft tissue and fascia was excised.  There was necrotic muscle that was removed with both a 10 blade knife and a rondure.  This was debrided back to healthy viable muscle.  This left a wound that was 7 cm in circumference.  After irrigation with normal saline local tissue rearrangement was used to close the wound with 2-0 nylon.  Attention was then focused for locking the nail proximally.  The rotation of both legs was checked this was symmetric.   Incision was made over the mid thigh.  Blunt dissection was carried down to the bone.  A drill bit was placed against the bone and with C-arm fluoroscopy was center center in the proximal dynamic screw hole.  This was drilled 2 cortices and a 36 mm compression screw was applied C-arm fluoroscopy verified alignment.  Wound was irrigated normal saline incision was closed using 2-0 nylon.  A sterile dressing was applied patient was extubated taken the PACU in stable condition.   DISCHARGE PLANNING:  Antibiotic duration: Continue antibiotics for 24 hours  Weightbearing: Nonweightbearing on the left weightbearing as tolerated on the right with the knee immobilizer.  Pain medication: Opioid pathway ordered  Dressing care/ Wound VAC: Reinforce dressing as needed  Ambulatory devices: Walker  Discharge to: Possible discharge to skilled nursing.  Follow-up: In the office 1 week post operative.

## 2019-04-21 NOTE — Anesthesia Postprocedure Evaluation (Signed)
Anesthesia Post Note  Patient: Eric [name]  Procedure(s) Performed: INTRAMEDULLARY (IM) RETROGRADE FEMORAL NAILING (Left Leg Upper) Eye Exam Under Anesthesia (Right Eye)     Patient location during evaluation: PACU Anesthesia Type: General Level of consciousness: awake and patient cooperative Pain management: pain level controlled Vital Signs Assessment: post-procedure vital signs reviewed and stable Respiratory status: spontaneous breathing, nonlabored ventilation, respiratory function stable and patient connected to nasal cannula oxygen Cardiovascular status: blood pressure returned to baseline and stable Postop Assessment: no apparent nausea or vomiting Anesthetic complications: no    Last Vitals:  Vitals:   04/21/19 0800 04/21/19 0828  BP: 122/82   Pulse: (!) 111 (!) 121  Resp: (!) 24 18  Temp: 36.8 C   SpO2: 94% 91%    Last Pain:  Vitals:   04/21/19 0800  TempSrc: Oral  PainSc: Asleep                 Tysheem Accardo

## 2019-04-21 NOTE — Progress Notes (Signed)
Inpatient Rehabilitation-Admissions Coordinator   Consult received. Noted pt is in surgery today. Will follow up tomorrow for IP Rehab assessment.   Jhonnie Garner, OTR/L  Rehab Admissions Coordinator  [phone] 04/21/2019 1:17 PM

## 2019-04-21 NOTE — Interval H&P Note (Signed)
History and Physical Interval Note:  04/21/2019 7:01 AM  Eric [name]  has presented today for surgery, with the diagnosis of open left femur fracture.  The various methods of treatment have been discussed with the patient and family. After consideration of risks, benefits and other options for treatment, the patient has consented to  Procedure(s): OPEN INTRAMEDULLARY (IM) NAIL FEMORAL (Left) IRRIGATION AND DEBRIDEMENT EXTREMITY (Left) as a surgical intervention.  The patient's history has been reviewed, patient examined, no change in status, stable for surgery.  I have reviewed the patient's chart and labs.  Questions were answered to the patient's satisfaction.     Newt Minion

## 2019-04-21 NOTE — Progress Notes (Addendum)
[facility] Progress Note  Day of Surgery  Subjective: CC: pain Patient complaining of pain in legs. Discussed ice to face post-op for swelling. Patient denies abdominal pain or nausea. Denies SOB.   Objective: Vital signs in last 24 hours: Temp:  [98.1 F (36.7 C)-101.7 F (38.7 C)] 98.2 F (36.8 C) (10/28 0800) Pulse Rate:  [107-139] 121 (10/28 0828) Resp:  [13-24] 18 (10/28 0828) BP: (117-160)/(69-91) 122/82 (10/28 0800) SpO2:  [91 %-100 %] 91 % (10/28 0828) Weight:  [107.6 kg] 107.6 kg (10/28 0951) Last BM Date: (pta)  Intake/Output from previous day: 10/27 0701 - 10/28 0700 In: 2247.7 [P.O.:240; I.V.:1763.4; IV Piggyback:244.3] Out: 1850 [Urine:1850] Intake/Output this shift: Total I/O In: -  Out: 250 [Urine:250]  PE: Gen:  Alert, NAD, pleasant ENT: facial swelling and abrasions Card:  Sinus tachycardia, pedal pulses 2+ BL Pulm:  Normal effort, clear to auscultation bilaterally Abd: Soft, non-tender, non-distended, +BS Ext: LLE with ACE and small amt of bloody drainage, RLE in KI, feet NVT bilaterally Skin: warm and dry, no rashes  Psych: A&Ox3   Lab Results:  Recent Labs    04/20/19 0624 04/21/19 0317  WBC 8.9 8.8  HGB 12.0* 10.9*  HCT 35.7* 33.1*  PLT 115* 131*   BMET Recent Labs    04/20/19 0624 04/21/19 0317  NA 140 136  K 3.7 3.7  CL 106 103  CO2 26 26  GLUCOSE 102* 121*  BUN 11 10  CREATININE 3.41* 0.95  CALCIUM 8.3* 8.1*   PT/INR Recent Labs    04/18/19 1740  LABPROT 12.6  INR 1.0   CMP     Component Value Date/Time   NA 136 04/21/2019 0317   K 3.7 04/21/2019 0317   CL 103 04/21/2019 0317   CO2 26 04/21/2019 0317   GLUCOSE 121 (H) 04/21/2019 0317   BUN 10 04/21/2019 0317   CREATININE 0.95 04/21/2019 0317   CALCIUM 8.1 (L) 04/21/2019 0317   PROT 6.1 (L) 04/19/2019 0436   ALBUMIN 3.4 (L) 04/19/2019 0436   AST 62 (H) 04/19/2019 0436   ALT 51 (H) 04/19/2019 0436   ALKPHOS 61 04/19/2019 0436   BILITOT 0.4  04/19/2019 0436   GFRNONAA >60 04/21/2019 0317   GFRAA >60 04/21/2019 0317   Lipase  No results found for: LIPASE     Studies/Results: No results found.  Anti-infectives: Anti-infectives (From admission, onward)   Start     Dose/Rate Route Frequency Ordered Stop   04/20/19 1400  [MAR Hold]  ceFAZolin (ANCEF) IVPB 2g/100 mL premix     (MAR Hold since Wed 04/21/2019 at 0944.Hold Reason: Transfer to a Procedural area.)   2 g 200 mL/hr over 30 Minutes Intravenous Every 8 hours 04/20/19 0955     04/19/19 1000  cefTRIAXone (ROCEPHIN) 2 g in sodium chloride 0.9 % 100 mL IVPB  Status:  Discontinued     2 g 200 mL/hr over 30 Minutes Intravenous Every 24 hours 04/19/19 0955 04/20/19 0955   04/19/19 0600  ceFAZolin (ANCEF) IVPB 1 g/50 mL premix  Status:  Discontinued     1 g 100 mL/hr over 30 Minutes Intravenous Every 8 hours 04/19/19 0345 04/19/19 0955   04/18/19 2249  ceFAZolin (ANCEF) 2-4 GM/100ML-% IVPB    Note to Pharmacy: Ivar Drape   : cabinet override      04/18/19 2249 04/19/19 1059   04/18/19 1745  ceFAZolin (ANCEF) IVPB 2g/100 mL premix     2 g 200 mL/hr over 30 Minutes  Intravenous  Once 04/18/19 1742 04/18/19 1758       Assessment/Plan PHBC Complex laceration right nasal ala - repaired by ENT (Dr. Pollyann Kennedy) Forehead hematoma - monitor clinically Right orbital fracture - minimally displaced and will not require surgical intervention per ENT.  R maxillary fracture - possible palate mobility on the right side. ENTplanning OR tomorrow  L frontal sinus frx - no intervention per NSGY (Dr. Franky Macho) Left open femur fracture - IMN 10/25 (Dr. Lajoyce Corners), repeat I&D 10/28 Right tibial plateau fracture -KI for now, OR 10/28 (Dr. Lajoyce Corners) AKI - Cr 0.95, UOP good, improving  R eye subconjunctival hemorrhage - continue to monitor, appreciate ophtho eval   FEN - dysphagia 3 diet ID - CTX started for frontal sinus frx, per NSGY okay to de-escalate. Open femur frx, will continue ancef  until return to OR 10/28; Tmax 101.7 WBC normalized, monitor CBC DVT - LMWH  Dispo - OR with ortho today. OR with ENT tomorrow. PT/OT recommending CIR, pain control.   LOS: 2 days    Wells Guiles , [facility] 04/21/2019, 10:02 AM Please see Amion for pager number during day hours 7:00am-4:30pm

## 2019-04-21 NOTE — Transfer of Care (Signed)
Immediate Anesthesia Transfer of Care Note  Patient: [name]  Procedure(s) Performed: OPEN INTRAMEDULLARY (IM) NAIL FEMORAL (Left Leg Upper) IRRIGATION AND DEBRIDEMENT EXTREMITY (Left Leg Upper)  Patient Location: PACU  Anesthesia Type:General  Level of Consciousness: drowsy  Airway & Oxygen Therapy: Patient Spontanous Breathing and Patient connected to face mask oxygen  Post-op Assessment: Report given to RN and Post -op Vital signs reviewed and stable  Post vital signs: Reviewed  Last Vitals:  Vitals Value Taken Time  BP 139/88 04/21/19 1233  Temp    Pulse 101 04/21/19 1237  Resp 18 04/21/19 1237  SpO2 99 % 04/21/19 1237  Vitals shown include unvalidated device data.  Last Pain:  Vitals:   04/21/19 0800  TempSrc: Oral  PainSc: Asleep      Patients Stated Pain Goal: 3 (52/84/13 2440)  Complications: No apparent anesthesia complications

## 2019-04-21 NOTE — Progress Notes (Signed)
PT Cancellation Note  Patient Details Name: Eric [name] MRN: [id number] DOB: 16-Dec-1970   Cancelled Treatment:    Reason Eval/Treat Not Completed: Patient at procedure or test/unavailable;Patient not medically ready.  Pt was in OR on arrival and unable to get back today.  Will see 10/29 as able. 04/21/2019  Donnella Sham, [place] [phone]  (pager) [phone]  (office)   Eric [name] 04/21/2019, 6:48 PM

## 2019-04-21 NOTE — Anesthesia Preprocedure Evaluation (Addendum)
Anesthesia Evaluation  Patient identified by MRN, date of birth, ID band  Reviewed: Allergy & Precautions, NPO status , Patient's Chart, lab work & pertinent test results, Unable to perform ROS - Chart review onlyPreop documentation limited or incomplete due to emergent nature of procedure.  History of Anesthesia Complications Negative for: history of anesthetic complications  Airway Mallampati: IV  TM Distance: >3 FB Neck ROM: Full   Comment: Limited exam due to pain Dental  (+) Dental Advisory Given   Pulmonary neg pulmonary ROS, neg COPD, neg recent URI, Not current smoker,    breath sounds clear to auscultation       Cardiovascular hypertension, (-) angina(-) Past MI and (-) CHF  Rhythm:Regular     Neuro/Psych negative neurological ROS     GI/Hepatic negative GI ROS, Neg liver ROS,   Endo/Other  negative endocrine ROS  Renal/GU negative Renal ROS     Musculoskeletal   Abdominal   Peds  Hematology   Anesthesia Other Findings 1.  Complex laceration right nasal ala 2.  Right orbital fracture with globe injury.  Orbital fracture is minimally displaced and will not require surgical intervention.  Globe injury is being treated by ophthalmology. 3.  Left frontal sinus fracture, anterior and posterior table, nondisplaced.   4.  Right maxillary fracture with possible palate mobility on the right side.  5. Open left femur fx  Reproductive/Obstetrics                             Anesthesia Physical Anesthesia Plan  ASA: III  Anesthesia Plan: General   Post-op Pain Management:    Induction: Intravenous and Rapid sequence  PONV Risk Score and Plan: 2 and Ondansetron and Dexamethasone  Airway Management Planned: Oral ETT and Video Laryngoscope Planned  Additional Equipment: None  Intra-op Plan:   Post-operative Plan: Extubation in OR  Informed Consent: I have reviewed the patients  History and Physical, chart, labs and discussed the procedure including the risks, benefits and alternatives for the proposed anesthesia with the patient or authorized representative who has indicated his/her understanding and acceptance.     Dental advisory given  Plan Discussed with: CRNA and Surgeon  Anesthesia Plan Comments:         Anesthesia Quick Evaluation

## 2019-04-21 NOTE — Progress Notes (Signed)
RN received verbal order from trauma PA that Pt could travel without cardiac monitoring

## 2019-04-22 ENCOUNTER — Inpatient Hospital Stay (HOSPITAL_COMMUNITY): Payer: Managed Care, Other (non HMO) | Admitting: Anesthesiology

## 2019-04-22 ENCOUNTER — Encounter (HOSPITAL_COMMUNITY): Admission: EM | Disposition: A | Discharge status: Rehabilitation Facility | Payer: Self-pay | Source: Home / Self Care

## 2019-04-22 ENCOUNTER — Encounter (HOSPITAL_COMMUNITY): Payer: Self-pay | Admitting: Orthopedic Surgery

## 2019-04-22 HISTORY — PX: CLOSED REDUCTION MANDIBLE WITH MANDIBULOMA: SHX5313

## 2019-04-22 LAB — CBC
HCT: 33.2 % — ABNORMAL LOW (ref 39.0–52.0)
Hemoglobin: 10.9 g/dL — ABNORMAL LOW (ref 13.0–17.0)
MCH: 29.1 pg (ref 26.0–34.0)
MCHC: 32.8 g/dL (ref 30.0–36.0)
MCV: 88.5 fL (ref 80.0–100.0)
Platelets: 146 10*3/uL — ABNORMAL LOW (ref 150–400)
RBC: 3.75 MIL/uL — ABNORMAL LOW (ref 4.22–5.81)
RDW: 13.1 % (ref 11.5–15.5)
WBC: 9.4 10*3/uL (ref 4.0–10.5)
nRBC: 0 % (ref 0.0–0.2)

## 2019-04-22 LAB — BASIC METABOLIC PANEL
Anion gap: 9 (ref 5–15)
BUN: 11 mg/dL (ref 6–20)
CO2: 27 mmol/L (ref 22–32)
Calcium: 8.3 mg/dL — ABNORMAL LOW (ref 8.9–10.3)
Chloride: 103 mmol/L (ref 98–111)
Creatinine, Ser: 1.02 mg/dL (ref 0.61–1.24)
GFR calc Af Amer: >60 mL/min (ref >60)
GFR calc non Af Amer: >60 mL/min (ref >60)
Glucose, Bld: 112 mg/dL — ABNORMAL HIGH (ref 70–99)
Potassium: 3.9 mmol/L (ref 3.5–5.1)
Sodium: 139 mmol/L (ref 135–145)

## 2019-04-22 LAB — PHOSPHORUS: Phosphorus: 3.2 mg/dL (ref 2.5–4.6)

## 2019-04-22 LAB — MAGNESIUM: Magnesium: 2.3 mg/dL (ref 1.7–2.4)

## 2019-04-22 SURGERY — CLOSED REDUCTION, MANDIBLE, WITH ARCH BAR APPLICATION AND INTERMAXILLARY FIXATION
Anesthesia: General | Laterality: Right

## 2019-04-22 MED ORDER — GLYCOPYRROLATE PF 0.2 MG/ML IJ SOSY
PREFILLED_SYRINGE | INTRAMUSCULAR | Status: AC
  Filled 2019-04-22: qty 1

## 2019-04-22 MED ORDER — OXYCODONE HCL 5 MG PO TABS: 5.0000 mg | ORAL_TABLET | Freq: Once | ORAL | Status: DC | PRN

## 2019-04-22 MED ORDER — ONDANSETRON HCL 4 MG/2ML IJ SOLN
INTRAMUSCULAR | Status: DC | PRN
  Administered 2019-04-22: 4 mg via INTRAVENOUS

## 2019-04-22 MED ORDER — LIDOCAINE 2% (20 MG/ML) 5 ML SYRINGE
INTRAMUSCULAR | Status: AC
  Filled 2019-04-22: qty 5

## 2019-04-22 MED ORDER — HYDROMORPHONE HCL 1 MG/ML IJ SOLN
0.2500 mg | INTRAMUSCULAR | Status: DC | PRN
  Administered 2019-04-22 (×2): 0.5 mg via INTRAVENOUS

## 2019-04-22 MED ORDER — MIDAZOLAM HCL 2 MG/2ML IJ SOLN
INTRAMUSCULAR | Status: AC
  Filled 2019-04-22: qty 2

## 2019-04-22 MED ORDER — PROPOFOL 10 MG/ML IV BOLUS
INTRAVENOUS | Status: DC | PRN
  Administered 2019-04-22: 200 mg via INTRAVENOUS

## 2019-04-22 MED ORDER — LIDOCAINE 2% (20 MG/ML) 5 ML SYRINGE
INTRAMUSCULAR | Status: DC | PRN
  Administered 2019-04-22: 100 mg via INTRAVENOUS

## 2019-04-22 MED ORDER — FENTANYL CITRATE (PF) 250 MCG/5ML IJ SOLN
INTRAMUSCULAR | Status: AC
  Filled 2019-04-22: qty 5

## 2019-04-22 MED ORDER — LACTATED RINGERS IV SOLN
INTRAVENOUS | Status: DC | PRN
  Administered 2019-04-22 (×2): via INTRAVENOUS

## 2019-04-22 MED ORDER — ACETAMINOPHEN 500 MG PO TABS
1000.0000 mg | ORAL_TABLET | Freq: Once | ORAL | Status: AC
  Administered 2019-04-22: 1000 mg via ORAL
  Filled 2019-04-22: qty 2

## 2019-04-22 MED ORDER — SUCCINYLCHOLINE CHLORIDE 200 MG/10ML IV SOSY
PREFILLED_SYRINGE | INTRAVENOUS | Status: DC | PRN
  Administered 2019-04-22: 140 mg via INTRAVENOUS

## 2019-04-22 MED ORDER — PHENYLEPHRINE 40 MCG/ML (10ML) SYRINGE FOR IV PUSH (FOR BLOOD PRESSURE SUPPORT)
PREFILLED_SYRINGE | INTRAVENOUS | Status: AC
  Filled 2019-04-22: qty 10

## 2019-04-22 MED ORDER — FENTANYL CITRATE (PF) 100 MCG/2ML IJ SOLN
INTRAMUSCULAR | Status: DC | PRN
  Administered 2019-04-22 (×2): 50 ug via INTRAVENOUS
  Administered 2019-04-22: 100 ug via INTRAVENOUS
  Administered 2019-04-22: 50 ug via INTRAVENOUS

## 2019-04-22 MED ORDER — LIDOCAINE-EPINEPHRINE 1 %-1:100000 IJ SOLN
INTRAMUSCULAR | Status: DC | PRN
  Administered 2019-04-22: 2 mL

## 2019-04-22 MED ORDER — GLYCOPYRROLATE PF 0.2 MG/ML IJ SOSY
PREFILLED_SYRINGE | INTRAMUSCULAR | Status: DC | PRN
  Administered 2019-04-22: .1 mg via INTRAVENOUS

## 2019-04-22 MED ORDER — CLINDAMYCIN PHOSPHATE 900 MG/50ML IV SOLN
INTRAVENOUS | Status: DC | PRN
  Administered 2019-04-22: 900 mg via INTRAVENOUS

## 2019-04-22 MED ORDER — OXYMETAZOLINE HCL 0.05 % NA SOLN
NASAL | Status: DC | PRN
  Administered 2019-04-22: 1 via NASAL
  Administered 2019-04-22: 2 via NASAL

## 2019-04-22 MED ORDER — GABAPENTIN 300 MG PO CAPS
300.0000 mg | ORAL_CAPSULE | Freq: Three times a day (TID) | ORAL | Status: DC
  Administered 2019-04-22 – 2019-04-26 (×12): 300 mg via ORAL
  Filled 2019-04-22 (×13): qty 1

## 2019-04-22 MED ORDER — SUGAMMADEX SODIUM 200 MG/2ML IV SOLN
INTRAVENOUS | Status: DC | PRN
  Administered 2019-04-22: 400 mg via INTRAVENOUS

## 2019-04-22 MED ORDER — OXYCODONE HCL 5 MG/5ML PO SOLN: 5.0000 mg | Freq: Once | ORAL | Status: DC | PRN

## 2019-04-22 MED ORDER — DEXMEDETOMIDINE HCL 200 MCG/2ML IV SOLN
INTRAVENOUS | Status: DC | PRN
  Administered 2019-04-22: 28 ug via INTRAVENOUS
  Administered 2019-04-22: 12 ug via INTRAVENOUS

## 2019-04-22 MED ORDER — ROCURONIUM BROMIDE 10 MG/ML (PF) SYRINGE
PREFILLED_SYRINGE | INTRAVENOUS | Status: AC
  Filled 2019-04-22: qty 10

## 2019-04-22 MED ORDER — PHENYLEPHRINE HCL-NACL 10-0.9 MG/250ML-% IV SOLN
INTRAVENOUS | Status: DC | PRN
  Administered 2019-04-22: 30 ug/min via INTRAVENOUS

## 2019-04-22 MED ORDER — DEXAMETHASONE SODIUM PHOSPHATE 10 MG/ML IJ SOLN
INTRAMUSCULAR | Status: AC
  Filled 2019-04-22: qty 1

## 2019-04-22 MED ORDER — PROPOFOL 10 MG/ML IV BOLUS
INTRAVENOUS | Status: AC
  Filled 2019-04-22: qty 20

## 2019-04-22 MED ORDER — DEXAMETHASONE SODIUM PHOSPHATE 10 MG/ML IJ SOLN
INTRAMUSCULAR | Status: DC | PRN
  Administered 2019-04-22: 10 mg via INTRAVENOUS

## 2019-04-22 MED ORDER — ROCURONIUM BROMIDE 50 MG/5ML IV SOSY
PREFILLED_SYRINGE | INTRAVENOUS | Status: DC | PRN
  Administered 2019-04-22: 50 mg via INTRAVENOUS

## 2019-04-22 MED ORDER — GABAPENTIN 600 MG PO TABS: 300.0000 mg | ORAL_TABLET | Freq: Three times a day (TID) | ORAL | Status: DC

## 2019-04-22 MED ORDER — LACTATED RINGERS IV SOLN
INTRAVENOUS | Status: DC
  Administered 2019-04-22: 10:00:00 via INTRAVENOUS

## 2019-04-22 MED ORDER — HYDROMORPHONE HCL 1 MG/ML IJ SOLN
INTRAMUSCULAR | Status: AC
  Filled 2019-04-22: qty 1

## 2019-04-22 MED ORDER — OXYMETAZOLINE HCL 0.05 % NA SOLN
NASAL | Status: DC | PRN
  Administered 2019-04-22: 1 via TOPICAL

## 2019-04-22 MED ORDER — PHENYLEPHRINE 40 MCG/ML (10ML) SYRINGE FOR IV PUSH (FOR BLOOD PRESSURE SUPPORT)
PREFILLED_SYRINGE | INTRAVENOUS | Status: DC | PRN
  Administered 2019-04-22 (×2): 80 ug via INTRAVENOUS

## 2019-04-22 MED ORDER — PROMETHAZINE HCL 25 MG/ML IJ SOLN: 6.2500 mg | INTRAMUSCULAR | Status: DC | PRN

## 2019-04-22 MED ORDER — LIDOCAINE-EPINEPHRINE 1 %-1:100000 IJ SOLN
INTRAMUSCULAR | Status: AC
  Filled 2019-04-22: qty 1

## 2019-04-22 MED ORDER — OXYMETAZOLINE HCL 0.05 % NA SOLN
NASAL | Status: AC
  Filled 2019-04-22: qty 30

## 2019-04-22 MED ORDER — ONDANSETRON HCL 4 MG/2ML IJ SOLN
INTRAMUSCULAR | Status: AC
  Filled 2019-04-22: qty 2

## 2019-04-22 MED ORDER — MIDAZOLAM HCL 5 MG/5ML IJ SOLN
INTRAMUSCULAR | Status: DC | PRN
  Administered 2019-04-22: 2 mg via INTRAVENOUS

## 2019-04-22 MED ORDER — SUCCINYLCHOLINE CHLORIDE 200 MG/10ML IV SOSY
PREFILLED_SYRINGE | INTRAVENOUS | Status: AC
  Filled 2019-04-22: qty 10

## 2019-04-22 SURGICAL SUPPLY — 38 items
BIT DRILL TWIST 1.3X5 (BIT) ×4
BIT DRILL TWIST 1.3X5MM (BIT) IMPLANT
BLADE SURG 15 STRL LF DISP TIS (BLADE) IMPLANT
BLADE SURG 15 STRL SS (BLADE)
CANISTER SUCT 3000ML PPV (MISCELLANEOUS) ×4 IMPLANT
CLEANER TIP ELECTROSURG 2X2 (MISCELLANEOUS) ×4 IMPLANT
COVER WAND RF STERILE (DRAPES) ×2 IMPLANT
DECANTER SPIKE VIAL GLASS SM (MISCELLANEOUS) ×2 IMPLANT
DRAPE HALF SHEET 40X57 (DRAPES) ×2 IMPLANT
DRILL BIT TWIST 1.3X5MM (BIT) ×4
ELECT COATED BLADE 2.86 ST (ELECTRODE) ×2 IMPLANT
ELECT REM PT RETURN 9FT ADLT (ELECTROSURGICAL) ×4
ELECTRODE REM PT RTRN 9FT ADLT (ELECTROSURGICAL) ×2 IMPLANT
GLOVE ECLIPSE 7.5 STRL STRAW (GLOVE) ×4 IMPLANT
GOWN STRL REUS W/ TWL LRG LVL3 (GOWN DISPOSABLE) ×4 IMPLANT
GOWN STRL REUS W/TWL LRG LVL3 (GOWN DISPOSABLE) ×4
KIT BASIN OR (CUSTOM PROCEDURE TRAY) ×4 IMPLANT
KIT TURNOVER KIT B (KITS) ×4 IMPLANT
NDL PRECISIONGLIDE 27X1.5 (NEEDLE) ×2 IMPLANT
NEEDLE PRECISIONGLIDE 27X1.5 (NEEDLE) ×8 IMPLANT
NS IRRIG 1000ML POUR BTL (IV SOLUTION) ×4 IMPLANT
PAD ARMBOARD 7.5X6 YLW CONV (MISCELLANEOUS) ×8 IMPLANT
PENCIL FOOT CONTROL (ELECTRODE) ×4 IMPLANT
PLATE HYBRID MMF SM (Plate) ×4 IMPLANT
PLATE MID FACE 5H 5MM 100D RT (Plate) ×2 IMPLANT
SCISSORS WIRE ANG 4 3/4 DISP (INSTRUMENTS) ×4 IMPLANT
SCREW LOCK SELFDRIL 2.0X8M MMF (Screw) ×6 IMPLANT
SCREW LOCKING SELF DRILL 2.0X6 (Screw) ×12 IMPLANT
SCREW MID FACE 1.7X5MM SLF TAP (Screw) ×6 IMPLANT
SCREW MID FACE 1.7X6MM SLF TAP (Screw) ×4 IMPLANT
SUT CHROMIC 3 0 PS 2 (SUTURE) ×8 IMPLANT
SUT STEEL 0 (SUTURE) ×2
SUT STEEL 0 18XMFL TIE 17 (SUTURE) IMPLANT
SUT STEEL 2 (SUTURE) IMPLANT
SUT VIC AB 3-0 PS2 18 (SUTURE) ×2
SUT VIC AB 3-0 PS2 18XBRD (SUTURE) IMPLANT
SUT VICRYL 4-0 PS2 18IN ABS (SUTURE) ×4 IMPLANT
TRAY ENT MC OR (CUSTOM PROCEDURE TRAY) ×4 IMPLANT

## 2019-04-22 NOTE — Discharge Instructions (Signed)
Stay on soft diet for 6 weeks.  Brush teeth as you normally do.   Rinse mouth with salt water 3 times daily.

## 2019-04-22 NOTE — Transfer of Care (Signed)
Immediate Anesthesia Transfer of Care Note  Patient: Eric [name]  Procedure(s) Performed: CLOSED REDUCTION MANDIBLE WITH MANDIBULOMAXILLARY FUSION OF RIGHT MAXILLARY FRACTURE (Right )  Patient Location: PACU  Anesthesia Type:General  Level of Consciousness: drowsy and patient cooperative  Airway & Oxygen Therapy: Patient Spontanous Breathing and Patient connected to face mask oxygen  Post-op Assessment: Report given to RN and Post -op Vital signs reviewed and stable  Post vital signs: Reviewed and stable  Last Vitals:  Vitals Value Taken Time  BP 122/72 04/22/19 1147  Temp    Pulse 31 04/22/19 1147  Resp 10 04/22/19 1147  SpO2 52 % 04/22/19 1147  Vitals shown include unvalidated device data.  Last Pain:  Vitals:   04/22/19 0828  TempSrc: Oral  PainSc:       Patients Stated Pain Goal: 0 (37/94/32 7614)  Complications: No apparent anesthesia complications
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
## 2019-04-22 NOTE — Progress Notes (Signed)
[facility] Progress Note  1 Day Post-Op  Subjective: CC: pain  Pain mostly in L thigh. Patient going to OR with ENT today. Denies abdominal pain or nausea. Denies chest pain or SOB. Passing flatus but no BM.   Objective: Vital signs in last 24 hours: Temp:  [97.5 F (36.4 C)-99.2 F (37.3 C)] 98.3 F (36.8 C) (10/29 0828) Pulse Rate:  [85-108] 91 (10/29 0828) Resp:  [11-23] 11 (10/29 0828) BP: (115-139)/(70-90) 127/74 (10/29 0828) SpO2:  [93 %-100 %] 98 % (10/29 0828) Weight:  [107.6 kg] 107.6 kg (10/28 0951) Last BM Date: (-PTA. )  Intake/Output from previous day: 10/28 0701 - 10/29 0700 In: 1543.8 [P.O.:360; I.V.:883.8; IV Piggyback:300] Out: 1875 [Urine:1825; Blood:50] Intake/Output this shift: Total I/O In: -  Out: 250 [Urine:250]  PE: Gen:  Alert, NAD, pleasant ENT: facial swelling and abrasions Card:  Sinus tachycardia, pedal pulses 2+ BL Pulm:  Normal effort, clear to auscultation bilaterally Abd: Soft, non-tender, non-distended, +BS Ext: LLE with ACE and small amt of bloody drainage, RLE in KI, feet NVT bilaterally Skin: warm and dry, no rashes  Psych: A&Ox3    Lab Results:  Recent Labs    04/21/19 0317 04/22/19 0416  WBC 8.8 9.4  HGB 10.9* 10.9*  HCT 33.1* 33.2*  PLT 131* 146*   BMET Recent Labs    04/21/19 0317 04/22/19 0416  NA 136 139  K 3.7 3.9  CL 103 103  CO2 26 27  GLUCOSE 121* 112*  BUN 10 11  CREATININE 0.95 1.02  CALCIUM 8.1* 8.3*   PT/INR No results for input(s): LABPROT, INR in the last 72 hours. CMP     Component Value Date/Time   NA 139 04/22/2019 0416   K 3.9 04/22/2019 0416   CL 103 04/22/2019 0416   CO2 27 04/22/2019 0416   GLUCOSE 112 (H) 04/22/2019 0416   BUN 11 04/22/2019 0416   CREATININE 1.02 04/22/2019 0416   CALCIUM 8.3 (L) 04/22/2019 0416   PROT 6.1 (L) 04/19/2019 0436   ALBUMIN 3.4 (L) 04/19/2019 0436   AST 62 (H) 04/19/2019 0436   ALT 51 (H) 04/19/2019 0436   ALKPHOS 61 04/19/2019 0436    BILITOT 0.4 04/19/2019 0436   GFRNONAA >60 04/22/2019 0416   GFRAA >60 04/22/2019 0416   Lipase  No results found for: LIPASE     Studies/Results: Dg C-arm 1-60 Min  Result Date: 04/21/2019 CLINICAL DATA:  Elective surgery. EXAM: LEFT FEMUR 2 VIEWS; DG C-ARM 1-60 MIN FLUOROSCOPY TIME:  27 seconds. COMPARISON:  April 18, 2019. FINDINGS: Two intraoperative fluoroscopic images were obtained of the left femur. These images demonstrate what appears to be the distal end of intramedullary rod. IMPRESSION: Fluoroscopic guidance provided during femur surgery. Electronically Signed   By: Marijo Conception M.D.   On: 04/21/2019 12:41   Dg Femur Min 2 Views Left  Result Date: 04/21/2019 CLINICAL DATA:  Elective surgery. EXAM: LEFT FEMUR 2 VIEWS; DG C-ARM 1-60 MIN FLUOROSCOPY TIME:  27 seconds. COMPARISON:  April 18, 2019. FINDINGS: Two intraoperative fluoroscopic images were obtained of the left femur. These images demonstrate what appears to be the distal end of intramedullary rod. IMPRESSION: Fluoroscopic guidance provided during femur surgery. Electronically Signed   By: Marijo Conception M.D.   On: 04/21/2019 12:41    Anti-infectives: Anti-infectives (From admission, onward)   Start     Dose/Rate Route Frequency Ordered Stop   04/21/19 1700  ceFAZolin (ANCEF) IVPB 2g/100 mL premix  Status:  Discontinued     2 g 200 mL/hr over 30 Minutes Intravenous Every 6 hours 04/21/19 1430 04/21/19 2128   04/20/19 1400  ceFAZolin (ANCEF) IVPB 2g/100 mL premix  Status:  Discontinued     2 g 200 mL/hr over 30 Minutes Intravenous Every 8 hours 04/20/19 0955 04/21/19 1436   04/19/19 1000  cefTRIAXone (ROCEPHIN) 2 g in sodium chloride 0.9 % 100 mL IVPB  Status:  Discontinued     2 g 200 mL/hr over 30 Minutes Intravenous Every 24 hours 04/19/19 0955 04/20/19 0955   04/19/19 0600  ceFAZolin (ANCEF) IVPB 1 g/50 mL premix  Status:  Discontinued     1 g 100 mL/hr over 30 Minutes Intravenous Every 8 hours  04/19/19 0345 04/19/19 0955   04/18/19 2249  ceFAZolin (ANCEF) 2-4 GM/100ML-% IVPB    Note to Pharmacy: Joneen Caraway   : cabinet override      04/18/19 2249 04/19/19 1059   04/18/19 1745  ceFAZolin (ANCEF) IVPB 2g/100 mL premix     2 g 200 mL/hr over 30 Minutes Intravenous  Once 04/18/19 1742 04/18/19 1758       Assessment/Plan PHBC Complex laceration right nasal ala- repaired by ENT (Dr. Pollyann Kennedy) Forehead hematoma- monitor clinically Right orbital fracture- minimally displaced and will not require surgical intervention per ENT.  R maxillary fracture- to OR with ENT today  L frontal sinus frx- no intervention perNSGY(Dr. Cabbell) Left open femur fracture- IMN 10/25 (Dr. Lajoyce Corners), repeat I&D 10/28, NWB LLE Right tibial plateau fracture-KI for now, per Dr. Lajoyce Corners, WBAT AKI- Cr 1.02, UOP good R eye subconjunctival hemorrhage- continue to monitor, appreciate ophtho eval   FEN- NPO ID- ancef 10/25>10/28; rocephin 10/26>10/27 DVT- Lovenox Follow up - Harolyn Rutherford  Dispo- OR with ENT today. PT/OT recommending CIR, pain control.   LOS: 3 days    Wells Guiles , [facility] 04/22/2019, 9:12 AM Please see Amion for pager number during day hours 7:00am-4:30pm

## 2019-04-22 NOTE — Progress Notes (Signed)
PT Cancellation Note  Patient Details Name: Eric [name] MRN: [id number] DOB: [birth year]   Cancelled Treatment:    Reason Eval/Treat Not Completed: Pain limiting ability to participate  Patient recently back from surgery and requesting to defer PT currently due to severe pain. RN has given pain meds just minutes ago and will allow meds to work. Applied ice pack to jaw to assist with pain control. Will attempt to see later today as schedule permits.   Barry Brunner, PT Pager [phone]   Rexanne Mano 04/22/2019, 2:24 PM

## 2019-04-22 NOTE — Anesthesia Postprocedure Evaluation (Signed)
Anesthesia Post Note  Patient: [name]  Procedure(s) Performed: CLOSED REDUCTION MANDIBLE WITH MANDIBULOMAXILLARY FUSION OF RIGHT MAXILLARY FRACTURE (Right )     Patient location during evaluation: PACU Anesthesia Type: General Level of consciousness: awake and alert and oriented Pain management: pain level controlled Vital Signs Assessment: post-procedure vital signs reviewed and stable Respiratory status: spontaneous breathing, nonlabored ventilation and respiratory function stable Cardiovascular status: blood pressure returned to baseline Postop Assessment: no apparent nausea or vomiting Anesthetic complications: no    Last Vitals:  Vitals:   04/22/19 1300 04/22/19 1302  BP:  118/82  Pulse: 91 90  Resp: 14 14  Temp:    SpO2: 95% 95%    Last Pain:  Vitals:   04/22/19 1245  TempSrc:   PainSc: 0-No pain                 Brennan Bailey

## 2019-04-22 NOTE — Progress Notes (Signed)
POD#1 Intramedullary Rodding  I and D Open Femur fracture. Denies fever, chills or calf pain. Pain well controlled VSS , alert pleasant. Bilaterally distal CMS is intact. Compartments are soft . Dressing CDI. Immobilizer in place on right leg.   Mobilize NWB on Left leg, May be WBAT on right leg with knee immobilizer in place Follow up in office In 1 week

## 2019-04-22 NOTE — Progress Notes (Signed)
SLP Cancellation Note  Patient Details Name: Eric [name] MRN: [id number] DOB: 08/23/1970   Cancelled treatment:       Reason Eval/Treat Not Completed: Pain limiting ability to participate.  Pt recently returned from surgery.  Lethargic and in pain.  Pt politely requested to defer therapy at this time to rest.  Will continue efforts to see pt for treatment as he is able to participate.   Shenita Trego, Selinda Orion 04/22/2019, 3:32 PM

## 2019-04-22 NOTE — Anesthesia Procedure Notes (Signed)
Procedure Name: Intubation Date/Time: 04/22/2019 10:31 AM Performed by: Orlie Dakin, CRNA Pre-anesthesia Checklist: Patient identified, Emergency Drugs available, Suction available and Patient being monitored Patient Re-evaluated:Patient Re-evaluated prior to induction Oxygen Delivery Method: Circle system utilized Preoxygenation: Pre-oxygenation with 100% oxygen Induction Type: IV induction and Rapid sequence Laryngoscope Size: Glidescope and 4 Grade View: Grade I Nasal Tubes: Nasal prep performed, Magill forceps- large, utilized, Left and Nasal Rae Tube size: 7.0 mm Number of attempts: 1 Airway Equipment and Method: Video-laryngoscopy Placement Confirmation: ETT inserted through vocal cords under direct vision and positive ETCO2 Tube secured with: Tape Dental Injury: Teeth and Oropharynx as per pre-operative assessment  Comments: Red rubber catheter to end of NTT passed nasally and then removed after orally observed, Glidescope used for DL and NTT placement.

## 2019-04-22 NOTE — Interval H&P Note (Signed)
History and Physical Interval Note:  04/22/2019 9:57 AM  Eric [name]  has presented today for surgery, with the diagnosis of facial fractures, orbital fx.  The various methods of treatment have been discussed with the patient and family. After consideration of risks, benefits and other options for treatment, the patient has consented to  Procedure(s): OPEN REDUCTION INTERNAL FIXATION (ORIF) ORBITAL FRACTURE (N/A) CLOSED REDUCTION MANDIBLE WITH MANDIBULOMAXILLARY FUSION OF RIGHT MAXILLARY FRACTURE (Right) as a surgical intervention.  The patient's history has been reviewed, patient examined, no change in status, stable for surgery.  I have reviewed the patient's chart and labs.  Questions were answered to the patient's satisfaction.     [name]

## 2019-04-22 NOTE — Op Note (Signed)
OPERATIVE REPORT  DATE OF SURGERY: 04/22/2019  PATIENT:  Eric [name],  48 y.o. male  PRE-OPERATIVE DIAGNOSIS:  facial fractures, orbital fx  POST-OPERATIVE DIAGNOSIS:  facial fractures, orbital fx  PROCEDURE:  Procedure(s): CLOSED REDUCTION MANDIBLE WITH MANDIBULOMAXILLARY FUSION OF RIGHT MAXILLARY FRACTURE  SURGEON:  Beckie Salts, MD  ASSISTANTS: None  ANESTHESIA:   General   EBL: 40 ml  DRAINS: None  LOCAL MEDICATIONS USED: 1% Xylocaine with epinephrine  SPECIMEN:  none  COUNTS:  Correct  PROCEDURE DETAILS: The patient was taken to the operating room and placed on the operating table in the supine position. Following induction of general endotracheal nasal anesthesia, the face was draped in a standard fashion.  A cheek retractor was used throughout the case.  Hybrid arch bars were placed superiorly and in the mandible.  6 mm [name] were used on the maxilla and 8 mm on the mandible.  Occlusion was achieved using wire loops to create mandibular maxillary fixation.  The right canine fossa laceration was identified which was in a vertical fashion and then was extended with a horizontal incision to dissect down towards the bone of the maxilla.  There is a comminuted fracture segment of the face of the maxilla.  Just below that there was intact bone just above the tooth roots and then the lateral buttress had a clean fracture line without displacement.  A 1.7 mm mid facial plate was used, and L type plate with 2 [name] on the a sending rim going along the posterior aspect of the fracture and 4 screw holes on the horizontal aspect on the face of the maxilla.  Two 6 mm [name] were used on the a sending limb and two 5 mm [name] on the anterior aspect.  There is good fixation.  The wound was irrigated with saline.  The incision was reapproximated with 3-0 Vicryl suture in a running fashion.  The MMF was taken down by removing the 2 wires and the lower arch bar was removed.  The maxillary  arch bar was kept in place to be a second appointment fixation.  The oral cavity was irrigated with saline and suctioned of secretions and blood.  Patient was awakened extubated and transferred to recovery in stable condition.    PATIENT DISPOSITION:  To PACU, stable

## 2019-04-23 LAB — CBC
HCT: 32 % — ABNORMAL LOW (ref 39.0–52.0)
Hemoglobin: 10.7 g/dL — ABNORMAL LOW (ref 13.0–17.0)
MCH: 29.4 pg (ref 26.0–34.0)
MCHC: 33.4 g/dL (ref 30.0–36.0)
MCV: 87.9 fL (ref 80.0–100.0)
Platelets: 163 10*3/uL (ref 150–400)
RBC: 3.64 MIL/uL — ABNORMAL LOW (ref 4.22–5.81)
RDW: 13.1 % (ref 11.5–15.5)
WBC: 10 10*3/uL (ref 4.0–10.5)
nRBC: 0 % (ref 0.0–0.2)

## 2019-04-23 LAB — PHOSPHORUS: Phosphorus: 3.2 mg/dL (ref 2.5–4.6)

## 2019-04-23 LAB — BASIC METABOLIC PANEL
Anion gap: 11 (ref 5–15)
BUN: 14 mg/dL (ref 6–20)
CO2: 23 mmol/L (ref 22–32)
Calcium: 8.5 mg/dL — ABNORMAL LOW (ref 8.9–10.3)
Chloride: 103 mmol/L (ref 98–111)
Creatinine, Ser: 0.89 mg/dL (ref 0.61–1.24)
GFR calc Af Amer: >60 mL/min (ref >60)
GFR calc non Af Amer: >60 mL/min (ref >60)
Glucose, Bld: 97 mg/dL (ref 70–99)
Potassium: 4.2 mmol/L (ref 3.5–5.1)
Sodium: 137 mmol/L (ref 135–145)

## 2019-04-23 LAB — MAGNESIUM: Magnesium: 2.2 mg/dL (ref 1.7–2.4)

## 2019-04-23 MED ORDER — MAGNESIUM CITRATE PO SOLN
0.5000 | Freq: Once | ORAL | Status: DC
  Filled 2019-04-23: qty 296

## 2019-04-23 MED ORDER — OXYCODONE HCL 5 MG PO TABS
5.0000 mg | ORAL_TABLET | ORAL | Status: DC | PRN
  Administered 2019-04-23 – 2019-04-26 (×13): 10 mg via ORAL
  Administered 2019-04-27: 5 mg via ORAL
  Administered 2019-04-27: 10 mg via ORAL
  Filled 2019-04-23 (×7): qty 2
  Filled 2019-04-23: qty 1
  Filled 2019-04-23 (×7): qty 2

## 2019-04-23 NOTE — Progress Notes (Signed)
Inpatient Rehabilitation Admissions Coordinator  We will follow up on Monday with his progress to assist with planning dispo.  Danne Baxter, RN, MSN Rehab Admissions Coordinator [phone] 04/23/2019 2:39 PM

## 2019-04-23 NOTE — Progress Notes (Signed)
Patient ID: Eric [name], male   DOB: 27-Nov-1970, 48 y.o.   MRN: [id number] Bledsoe brace ordered from [facility].  Patient is to wear this on his right knee does not need to be locked.

## 2019-04-23 NOTE — Progress Notes (Signed)
POD # 2 ORIF Open Femur Fracture. Denies fever chills or calf pain.VSS afebrile alert sitting up in chair. Dressing Clean dry and intact. Distal pulses 2+. Compartments soft. . Will need to follow up Dr. Sharol Given 1 week.
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
## 2019-04-23 NOTE — Progress Notes (Signed)
[facility] Progress Note  1 Day Post-Op  Subjective: CC: no complaints Patient doing well overall with pain control. Denies abdominal pain, nausea. Tolerating diet. Flatus but no BM  Objective: Vital signs in last 24 hours: Temp:  [97 F (36.1 C)-98.9 F (37.2 C)] 98.7 F (37.1 C) (10/30 0800) Pulse Rate:  [31-108] 92 (10/30 0800) Resp:  [10-19] 19 (10/30 0800) BP: (118-129)/(72-90) 126/74 (10/30 0800) SpO2:  [93 %-100 %] 96 % (10/30 0800) Last BM Date: (-PTA. )  Intake/Output from previous day: 10/29 0701 - 10/30 0700 In: 1494.7 [I.V.:1494.7] Out: 1500 [Urine:1500] Intake/Output this shift: No intake/output data recorded.  PE: Gen: Alert, NAD, pleasant ENT: facial swelling and abrasions Card:Sinus tachycardia, pedal pulses 2+ BL Pulm: Normal effort, clear to auscultation bilaterally Abd: Soft, non-tender, non-distended,+BS Ext: LLE with ACE and small amt of bloody drainage, RLE in KI, feet NVT bilaterally Skin: warm and dry, no rashes  Psych: A&Ox3    Lab Results:  Recent Labs    04/22/19 0416 04/23/19 0752  WBC 9.4 10.0  HGB 10.9* 10.7*  HCT 33.2* 32.0*  PLT 146* 163   BMET Recent Labs    04/22/19 0416 04/23/19 0505  NA 139 137  K 3.9 4.2  CL 103 103  CO2 27 23  GLUCOSE 112* 97  BUN 11 14  CREATININE 1.02 0.89  CALCIUM 8.3* 8.5*   PT/INR No results for input(s): LABPROT, INR in the last 72 hours. CMP     Component Value Date/Time   NA 137 04/23/2019 0505   K 4.2 04/23/2019 0505   CL 103 04/23/2019 0505   CO2 23 04/23/2019 0505   GLUCOSE 97 04/23/2019 0505   BUN 14 04/23/2019 0505   CREATININE 0.89 04/23/2019 0505   CALCIUM 8.5 (L) 04/23/2019 0505   PROT 6.1 (L) 04/19/2019 0436   ALBUMIN 3.4 (L) 04/19/2019 0436   AST 62 (H) 04/19/2019 0436   ALT 51 (H) 04/19/2019 0436   ALKPHOS 61 04/19/2019 0436   BILITOT 0.4 04/19/2019 0436   GFRNONAA >60 04/23/2019 0505   GFRAA >60 04/23/2019 0505   Lipase  No results found  for: LIPASE     Studies/Results: Dg C-arm 1-60 Min  Result Date: 04/21/2019 CLINICAL DATA:  Elective surgery. EXAM: LEFT FEMUR 2 VIEWS; DG C-ARM 1-60 MIN FLUOROSCOPY TIME:  27 seconds. COMPARISON:  April 18, 2019. FINDINGS: Two intraoperative fluoroscopic images were obtained of the left femur. These images demonstrate what appears to be the distal end of intramedullary rod. IMPRESSION: Fluoroscopic guidance provided during femur surgery. Electronically Signed   By: Marijo Conception M.D.   On: 04/21/2019 12:41   Dg Femur Min 2 Views Left  Result Date: 04/21/2019 CLINICAL DATA:  Elective surgery. EXAM: LEFT FEMUR 2 VIEWS; DG C-ARM 1-60 MIN FLUOROSCOPY TIME:  27 seconds. COMPARISON:  April 18, 2019. FINDINGS: Two intraoperative fluoroscopic images were obtained of the left femur. These images demonstrate what appears to be the distal end of intramedullary rod. IMPRESSION: Fluoroscopic guidance provided during femur surgery. Electronically Signed   By: Marijo Conception M.D.   On: 04/21/2019 12:41    Anti-infectives: Anti-infectives (From admission, onward)   Start     Dose/Rate Route Frequency Ordered Stop   04/21/19 1700  ceFAZolin (ANCEF) IVPB 2g/100 mL premix  Status:  Discontinued     2 g 200 mL/hr over 30 Minutes Intravenous Every 6 hours 04/21/19 1430 04/21/19 2128   04/20/19 1400  ceFAZolin (ANCEF) IVPB 2g/100 mL premix  Status:  Discontinued     2 g 200 mL/hr over 30 Minutes Intravenous Every 8 hours 04/20/19 0955 04/21/19 1436   04/19/19 1000  cefTRIAXone (ROCEPHIN) 2 g in sodium chloride 0.9 % 100 mL IVPB  Status:  Discontinued     2 g 200 mL/hr over 30 Minutes Intravenous Every 24 hours 04/19/19 0955 04/20/19 0955   04/19/19 0600  ceFAZolin (ANCEF) IVPB 1 g/50 mL premix  Status:  Discontinued     1 g 100 mL/hr over 30 Minutes Intravenous Every 8 hours 04/19/19 0345 04/19/19 0955   04/18/19 2249  ceFAZolin (ANCEF) 2-4 GM/100ML-% IVPB    Note to Pharmacy: Joneen Caraway   :  cabinet override      04/18/19 2249 04/19/19 1059   04/18/19 1745  ceFAZolin (ANCEF) IVPB 2g/100 mL premix     2 g 200 mL/hr over 30 Minutes Intravenous  Once 04/18/19 1742 04/18/19 1758       Assessment/Plan PHBC Complex laceration right nasal ala- repaired by ENT (Dr. Pollyann Kennedy) Forehead hematoma- monitor clinically Right orbital fracture- minimally displaced and will not require surgical intervention per ENT.  R maxillary fracture- s/p mandibulomaxillary fusion 10/29, per Dr. Pollyann Kennedy L frontal sinus frx- no intervention perNSGY(Dr. Cabbell) Left open femur fracture- IMN 10/25 (Dr. Lajoyce Corners), repeat I&D 10/28, NWB LLE Right tibial plateau fracture-KI for now, per Dr. Lajoyce Corners, WBAT AKI-Cr 0.89, UOP good R eye subconjunctival hemorrhage- continue to monitor, appreciate ophtho eval   FEN- soft diet  ID- ancef 10/25>10/28; rocephin 10/26>10/27 DVT- Lovenox Follow up - Harolyn Rutherford  Dispo- PT/OT recommending CIR, pain control.Stable for discharge to CIR when insurance complete/bed available  LOS: 4 days    Wells Guiles , [facility] 04/23/2019, 9:43 AM Please see Amion for pager number during day hours 7:00am-4:30pm

## 2019-04-23 NOTE — Progress Notes (Signed)
Orthopedic Tech Progress Note Patient Details:  Eric [name] 06-14-1971 [id number] Called [employer] for brace order. Patient ID: Eric [name], male   DOB: 09/01/1970, 48 y.o.   MRN: [id number]   Eric [name] 04/23/2019, 2:12 PM

## 2019-04-23 NOTE — Anesthesia Postprocedure Evaluation (Signed)
Anesthesia Post Note  Patient: [name]  Procedure(s) Performed: OPEN INTRAMEDULLARY (IM) NAIL FEMORAL (Left Leg Upper) IRRIGATION AND DEBRIDEMENT EXTREMITY (Left Leg Upper)     Patient location during evaluation: PACU Anesthesia Type: General Level of consciousness: awake and patient cooperative Pain management: pain level controlled Vital Signs Assessment: post-procedure vital signs reviewed and stable Respiratory status: spontaneous breathing, nonlabored ventilation, respiratory function stable and patient connected to nasal cannula oxygen Cardiovascular status: blood pressure returned to baseline and stable Postop Assessment: no apparent nausea or vomiting Anesthetic complications: no    Last Vitals:  Vitals:   04/22/19 2057 04/22/19 2102  BP: 124/72 129/77  Pulse: 91 85  Resp: 15   Temp: 37 C 37.2 C  SpO2: 100%     Last Pain:  Vitals:   04/22/19 2200  TempSrc:   PainSc: Asleep                 Debi Cousin

## 2019-04-24 ENCOUNTER — Encounter (HOSPITAL_COMMUNITY): Payer: Self-pay | Admitting: Otolaryngology

## 2019-04-24 LAB — BASIC METABOLIC PANEL
Anion gap: 9 (ref 5–15)
BUN: 11 mg/dL (ref 6–20)
CO2: 25 mmol/L (ref 22–32)
Calcium: 8.7 mg/dL — ABNORMAL LOW (ref 8.9–10.3)
Chloride: 104 mmol/L (ref 98–111)
Creatinine, Ser: 0.89 mg/dL (ref 0.61–1.24)
GFR calc Af Amer: >60 mL/min (ref >60)
GFR calc non Af Amer: >60 mL/min (ref >60)
Glucose, Bld: 118 mg/dL — ABNORMAL HIGH (ref 70–99)
Potassium: 3.7 mmol/L (ref 3.5–5.1)
Sodium: 138 mmol/L (ref 135–145)

## 2019-04-24 LAB — PHOSPHORUS: Phosphorus: 3.6 mg/dL (ref 2.5–4.6)

## 2019-04-24 LAB — CBC
HCT: 33.6 % — ABNORMAL LOW (ref 39.0–52.0)
Hemoglobin: 11.4 g/dL — ABNORMAL LOW (ref 13.0–17.0)
MCH: 29.6 pg (ref 26.0–34.0)
MCHC: 33.9 g/dL (ref 30.0–36.0)
MCV: 87.3 fL (ref 80.0–100.0)
Platelets: 203 10*3/uL (ref 150–400)
RBC: 3.85 MIL/uL — ABNORMAL LOW (ref 4.22–5.81)
RDW: 13 % (ref 11.5–15.5)
WBC: 9.2 10*3/uL (ref 4.0–10.5)
nRBC: 0 % (ref 0.0–0.2)

## 2019-04-24 LAB — MAGNESIUM: Magnesium: 2.1 mg/dL (ref 1.7–2.4)

## 2019-04-24 NOTE — Plan of Care (Signed)
  Problem: Education: Goal: Knowledge of General Education information will improve Description: Including pain rating scale, medication(s)/side effects and non-pharmacologic comfort measures Outcome: Progressing   Problem: Health Behavior/Discharge Planning: Goal: Ability to manage health-related needs will improve Outcome: Progressing   Problem: Clinical Measurements: Goal: Ability to maintain clinical measurements within normal limits will improve Outcome: Progressing Goal: Will remain free from infection Outcome: Progressing Goal: Cardiovascular complication will be avoided Outcome: Progressing   Problem: Activity: Goal: Risk for activity intolerance will decrease Outcome: Progressing   Problem: Elimination: Goal: Will not experience complications related to urinary retention Outcome: Progressing  Notes:  Patient afebrile and VSS for shift. Remains NWB right leg and WBAT on left. Reluctant at requesting analgesics and encouraged to call before pain gets severe. Received Hydromorphone 1 mg x 1 and oxycodone 10 mg twice for pain 7-10 out of 10 with good results after each dose received. Family in to visit in the evening.

## 2019-04-24 NOTE — Progress Notes (Signed)
2 Days Post-Op   Subjective/Chief Complaint: Had BM yesterday Tolerating diet No complaints - pain under control   Objective: Vital signs in last 24 hours: Temp:  [98 F (36.7 C)-99.4 F (37.4 C)] 99.4 F (37.4 C) (10/31 0800) Pulse Rate:  [91-115] 91 (10/31 0800) Resp:  [14-20] 17 (10/31 0800) BP: (120-140)/(76-87) 120/82 (10/31 0800) SpO2:  [96 %-100 %] 97 % (10/31 0800) Last BM Date: 04/23/19  Intake/Output from previous day: 10/30 0701 - 10/31 0700 In: 480 [P.O.:480] Out: 1025 [Urine:1025] Intake/Output this shift: Total I/O In: 240 [P.O.:240] Out: 550 [Urine:550]  Gen: Alert, NAD, pleasant ENT: facial swelling and abrasions Card:Sinus tachycardia, pedal pulses 2+ BL Pulm: Normal effort, clear to auscultation bilaterally Abd: Soft, non-tender, non-distended,+BS Ext: LLE with ACE and small amt of bloody drainage, RLE in KI, feet NVT bilaterally Skin: warm and dry, no rashes  Psych: A&Ox3  Lab Results:  Recent Labs    04/23/19 0752 04/24/19 1114  WBC 10.0 9.2  HGB 10.7* 11.4*  HCT 32.0* 33.6*  PLT 163 203   BMET Recent Labs    04/23/19 0505 04/24/19 1114  NA 137 138  K 4.2 3.7  CL 103 104  CO2 23 25  GLUCOSE 97 118*  BUN 14 11  CREATININE 0.89 0.89  CALCIUM 8.5* 8.7*   PT/INR No results for input(s): LABPROT, INR in the last 72 hours. ABG No results for input(s): PHART, HCO3 in the last 72 hours.  Invalid input(s): PCO2, PO2  Studies/Results: No results found.  Anti-infectives: Anti-infectives (From admission, onward)   Start     Dose/Rate Route Frequency Ordered Stop   04/21/19 1700  ceFAZolin (ANCEF) IVPB 2g/100 mL premix  Status:  Discontinued     2 g 200 mL/hr over 30 Minutes Intravenous Every 6 hours 04/21/19 1430 04/21/19 2128   04/20/19 1400  ceFAZolin (ANCEF) IVPB 2g/100 mL premix  Status:  Discontinued     2 g 200 mL/hr over 30 Minutes Intravenous Every 8 hours 04/20/19 0955 04/21/19 1436   04/19/19 1000  cefTRIAXone  (ROCEPHIN) 2 g in sodium chloride 0.9 % 100 mL IVPB  Status:  Discontinued     2 g 200 mL/hr over 30 Minutes Intravenous Every 24 hours 04/19/19 0955 04/20/19 0955   04/19/19 0600  ceFAZolin (ANCEF) IVPB 1 g/50 mL premix  Status:  Discontinued     1 g 100 mL/hr over 30 Minutes Intravenous Every 8 hours 04/19/19 0345 04/19/19 0955   04/18/19 2249  ceFAZolin (ANCEF) 2-4 GM/100ML-% IVPB    Note to Pharmacy: Ivar Drape   : cabinet override      04/18/19 2249 04/19/19 1059   04/18/19 1745  ceFAZolin (ANCEF) IVPB 2g/100 mL premix     2 g 200 mL/hr over 30 Minutes Intravenous  Once 04/18/19 1742 04/18/19 1758      Assessment/Plan: PHBC Complex laceration right nasal ala- repaired by ENT (Dr. Constance Holster) Forehead hematoma- monitor clinically Right orbital fracture- minimally displaced and will not require surgical intervention per ENT.  R maxillary fracture-s/p mandibulomaxillary fusion 10/29, per Dr. Constance Holster L frontal sinus frx- no intervention perNSGY(Dr. Cabbell) Left open femur fracture- IMN 10/25 (Dr. Sharol Given), repeat I&D 10/28, NWB LLE Right tibial plateau fracture-KI for now,per Dr. Sharol Given, WBAT AKI-Cr0.89, UOP good R eye subconjunctival hemorrhage- continue to monitor, appreciate ophtho eval   FEN- soft diet  ID-ancef 10/25>10/28; rocephin 10/26>10/27 DVT- Lovenox Follow up - [place], [place]- PT/OT recommending CIR, pain control.Stable for discharge to CIR when  insurance complete/bed available  LOS: 5 days    Eric [name] 04/24/2019

## 2019-04-25 LAB — PHOSPHORUS: Phosphorus: 3.7 mg/dL (ref 2.5–4.6)

## 2019-04-25 LAB — BASIC METABOLIC PANEL
Anion gap: 8 (ref 5–15)
BUN: 11 mg/dL (ref 6–20)
CO2: 25 mmol/L (ref 22–32)
Calcium: 8.8 mg/dL — ABNORMAL LOW (ref 8.9–10.3)
Chloride: 102 mmol/L (ref 98–111)
Creatinine, Ser: 0.93 mg/dL (ref 0.61–1.24)
GFR calc Af Amer: >60 mL/min (ref >60)
GFR calc non Af Amer: >60 mL/min (ref >60)
Glucose, Bld: 101 mg/dL — ABNORMAL HIGH (ref 70–99)
Potassium: 3.9 mmol/L (ref 3.5–5.1)
Sodium: 135 mmol/L (ref 135–145)

## 2019-04-25 LAB — CBC
HCT: 35.4 % — ABNORMAL LOW (ref 39.0–52.0)
Hemoglobin: 11.8 g/dL — ABNORMAL LOW (ref 13.0–17.0)
MCH: 29.3 pg (ref 26.0–34.0)
MCHC: 33.3 g/dL (ref 30.0–36.0)
MCV: 87.8 fL (ref 80.0–100.0)
Platelets: 233 10*3/uL (ref 150–400)
RBC: 4.03 MIL/uL — ABNORMAL LOW (ref 4.22–5.81)
RDW: 13.1 % (ref 11.5–15.5)
WBC: 9.7 10*3/uL (ref 4.0–10.5)
nRBC: 0 % (ref 0.0–0.2)

## 2019-04-25 LAB — MAGNESIUM: Magnesium: 2.1 mg/dL (ref 1.7–2.4)

## 2019-04-25 MED ORDER — SODIUM CHLORIDE 0.9% FLUSH: 10.0000 mL | INTRAVENOUS | Status: DC | PRN

## 2019-04-25 MED ORDER — SODIUM CHLORIDE 0.9% FLUSH
10.0000 mL | Freq: Two times a day (BID) | INTRAVENOUS | Status: DC
  Administered 2019-04-25 – 2019-04-26 (×3): 10 mL

## 2019-04-25 MED ORDER — ENSURE ENLIVE PO LIQD
237.0000 mL | Freq: Two times a day (BID) | ORAL | Status: DC
  Administered 2019-04-25 – 2019-04-27 (×6): 237 mL via ORAL

## 2019-04-25 NOTE — TOC Progression Note (Signed)
Transition of Care [facility]) - Progression Note    Patient Details  Name: Eric [name] MRN: [id number] Date of Birth: May 30, 1971  Transition of Care [facility]) CM/SW [place], [place] Phone Number: 04/25/2019, 9:48 AM  Clinical Narrative:  CSW met with patient to engage in SBIRT per trauma protocol. Patient scored a total of 0 on their SBIRT exam. Patient denied any current or notable history of susbstance use.       Expected Discharge Plan and Services                                                 Social Determinants of Health (SDOH) Interventions    Readmission Risk Interventions No flowsheet data found.

## 2019-04-25 NOTE — Progress Notes (Signed)
3 Days Post-Op   Subjective/Chief Complaint: C/o facial pain Tolerating diet No complaints - pain under control   Objective: Vital signs in last 24 hours: Temp:  [98.6 F (37 C)-99.5 F (37.5 C)] 99.1 F (37.3 C) (11/01 0800) Pulse Rate:  [93-106] 93 (11/01 0800) Resp:  [12-17] 12 (11/01 0800) BP: (116-132)/(79-89) 129/81 (11/01 0800) SpO2:  [94 %-100 %] 100 % (11/01 0800) Last BM Date: 04/23/19  Intake/Output from previous day: 10/31 0701 - 11/01 0700 In: 2470.1 [P.O.:600; I.V.:1870.1] Out: 1475 [Urine:1475] Intake/Output this shift: Total I/O In: -  Out: 1000 [Urine:1000]  Gen: Alert, NAD, pleasant ENT: facial swelling and abrasions Card:mild sinus tachycardia Pulm: Normal effort Abd: Soft, non-tender, non-distended Ext: LLE with ACE and small amt of bloody drainage, RLE in KI, feet NVT bilaterally Skin: warm and dry, no rashes  Psych: A&Ox3  Lab Results:  Recent Labs    04/24/19 1114 04/25/19 0632  WBC 9.2 9.7  HGB 11.4* 11.8*  HCT 33.6* 35.4*  PLT 203 233   BMET Recent Labs    04/24/19 1114 04/25/19 0632  NA 138 135  K 3.7 3.9  CL 104 102  CO2 25 25  GLUCOSE 118* 101*  BUN 11 11  CREATININE 0.89 0.93  CALCIUM 8.7* 8.8*   PT/INR No results for input(s): LABPROT, INR in the last 72 hours. ABG No results for input(s): PHART, HCO3 in the last 72 hours.  Invalid input(s): PCO2, PO2  Studies/Results: No results found.  Anti-infectives: Anti-infectives (From admission, onward)   Start     Dose/Rate Route Frequency Ordered Stop   04/21/19 1700  ceFAZolin (ANCEF) IVPB 2g/100 mL premix  Status:  Discontinued     2 g 200 mL/hr over 30 Minutes Intravenous Every 6 hours 04/21/19 1430 04/21/19 2128   04/20/19 1400  ceFAZolin (ANCEF) IVPB 2g/100 mL premix  Status:  Discontinued     2 g 200 mL/hr over 30 Minutes Intravenous Every 8 hours 04/20/19 0955 04/21/19 1436   04/19/19 1000  cefTRIAXone (ROCEPHIN) 2 g in sodium chloride 0.9 % 100 mL  IVPB  Status:  Discontinued     2 g 200 mL/hr over 30 Minutes Intravenous Every 24 hours 04/19/19 0955 04/20/19 0955   04/19/19 0600  ceFAZolin (ANCEF) IVPB 1 g/50 mL premix  Status:  Discontinued     1 g 100 mL/hr over 30 Minutes Intravenous Every 8 hours 04/19/19 0345 04/19/19 0955   04/18/19 2249  ceFAZolin (ANCEF) 2-4 GM/100ML-% IVPB    Note to Pharmacy: Joneen Caraway   : cabinet override      04/18/19 2249 04/19/19 1059   04/18/19 1745  ceFAZolin (ANCEF) IVPB 2g/100 mL premix     2 g 200 mL/hr over 30 Minutes Intravenous  Once 04/18/19 1742 04/18/19 1758      Assessment/Plan: PHBC Complex laceration right nasal ala- repaired by ENT (Dr. Pollyann Kennedy) Forehead hematoma- monitor clinically Right orbital fracture- minimally displaced and will not require surgical intervention per ENT.  R maxillary fracture-s/p mandibulomaxillary fusion 10/29, per Dr. Pollyann Kennedy L frontal sinus frx- no intervention perNSGY(Dr. Cabbell) Left open femur fracture- IMN 10/25 (Dr. Lajoyce Corners), repeat I&D 10/28, NWB LLE Right tibial plateau fracture-KI for now,per Dr. Lajoyce Corners, WBAT AKI-Cr0.89, UOP good R eye subconjunctival hemorrhage- continue to monitor, appreciate ophtho eval   FEN- soft diet  ID-ancef 10/25>10/28; rocephin 10/26>10/27 DVT- Lovenox Follow up - Harolyn Rutherford  Dispo- PT/OT recommending CIR, pain control.Stable for discharge to CIR when insurance complete/bed available  LOS: 6  days    Rosario Adie [id number]

## 2019-04-26 MED ORDER — GABAPENTIN 100 MG PO CAPS
100.0000 mg | ORAL_CAPSULE | Freq: Three times a day (TID) | ORAL | Status: DC
  Administered 2019-04-26 – 2019-04-27 (×3): 100 mg via ORAL
  Filled 2019-04-26 (×3): qty 1

## 2019-04-26 MED ORDER — TRAZODONE HCL 50 MG PO TABS
50.0000 mg | ORAL_TABLET | Freq: Every day | ORAL | Status: DC
  Administered 2019-04-26: 50 mg via ORAL
  Filled 2019-04-26: qty 1
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
## 2019-04-26 NOTE — Consult Note (Signed)
Inpatient Rehab Admissions:  Inpatient Rehab Consult received.  I met with pt at the bedside for rehabilitation assessment and to discuss goals and expectations of an inpatient rehab admission. Feel pt is a great candidate for CIR at this time. He was [facility] and has great social support from his girlfriend who can provide 24/7 A at DC. Pt is motivated to return to [place] and wants to pursue CIR if he is approved. I have confirmed a safe DC plan after CIR. Will begin insurance authorization process for possible admit.   Please call if questions.   Eric [name], OTR/L  Rehab Admissions Coordinator  [phone] 04/26/2019 1:27 PM
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
## 2019-04-27 ENCOUNTER — Other Ambulatory Visit: Payer: Self-pay

## 2019-04-27 ENCOUNTER — Inpatient Hospital Stay (HOSPITAL_COMMUNITY)
Admission: RE | Admit: 2019-04-27 | Discharge: 2019-05-08 | DRG: 560 | Disposition: A | Discharge status: Home / Self Care | Payer: Managed Care, Other (non HMO) | Source: Intra-hospital | Attending: Physical Medicine & Rehabilitation | Admitting: Physical Medicine & Rehabilitation

## 2019-04-27 ENCOUNTER — Encounter (HOSPITAL_COMMUNITY): Payer: Self-pay | Admitting: Nurse Practitioner

## 2019-04-27 ENCOUNTER — Encounter (HOSPITAL_COMMUNITY): Payer: Self-pay | Admitting: General Practice

## 2019-04-27 ENCOUNTER — Inpatient Hospital Stay (HOSPITAL_COMMUNITY): Payer: Managed Care, Other (non HMO)

## 2019-04-27 ENCOUNTER — Encounter (HOSPITAL_BASED_OUTPATIENT_CLINIC_OR_DEPARTMENT_OTHER): Payer: Self-pay | Admitting: Emergency Medicine

## 2019-04-27 DIAGNOSIS — G4700 Insomnia, unspecified: Secondary | ICD-10-CM | POA: Diagnosis present

## 2019-04-27 DIAGNOSIS — S0121XD Laceration without foreign body of nose, subsequent encounter: Secondary | ICD-10-CM | POA: Diagnosis not present

## 2019-04-27 DIAGNOSIS — S0240CD Maxillary fracture, right side, subsequent encounter for fracture with routine healing: Secondary | ICD-10-CM | POA: Diagnosis not present

## 2019-04-27 DIAGNOSIS — S0240CA Maxillary fracture, right side, initial encounter for closed fracture: Secondary | ICD-10-CM

## 2019-04-27 DIAGNOSIS — D62 Acute posthemorrhagic anemia: Secondary | ICD-10-CM | POA: Diagnosis present

## 2019-04-27 DIAGNOSIS — S72402E Unspecified fracture of lower end of left femur, subsequent encounter for open fracture type I or II with routine healing: Secondary | ICD-10-CM | POA: Diagnosis present

## 2019-04-27 DIAGNOSIS — M25511 Pain in right shoulder: Secondary | ICD-10-CM | POA: Diagnosis present

## 2019-04-27 DIAGNOSIS — S8991XA Unspecified injury of right lower leg, initial encounter: Secondary | ICD-10-CM | POA: Diagnosis present

## 2019-04-27 DIAGNOSIS — H1131 Conjunctival hemorrhage, right eye: Secondary | ICD-10-CM | POA: Diagnosis present

## 2019-04-27 DIAGNOSIS — R7301 Impaired fasting glucose: Secondary | ICD-10-CM | POA: Diagnosis present

## 2019-04-27 DIAGNOSIS — S025XXA Fracture of tooth (traumatic), initial encounter for closed fracture: Secondary | ICD-10-CM | POA: Diagnosis present

## 2019-04-27 DIAGNOSIS — R519 Headache, unspecified: Secondary | ICD-10-CM | POA: Diagnosis present

## 2019-04-27 DIAGNOSIS — K5903 Drug induced constipation: Secondary | ICD-10-CM

## 2019-04-27 DIAGNOSIS — S02609D Fracture of mandible, unspecified, subsequent encounter for fracture with routine healing: Secondary | ICD-10-CM | POA: Diagnosis not present

## 2019-04-27 DIAGNOSIS — K029 Dental caries, unspecified: Secondary | ICD-10-CM | POA: Diagnosis present

## 2019-04-27 DIAGNOSIS — R52 Pain, unspecified: Secondary | ICD-10-CM

## 2019-04-27 DIAGNOSIS — K06020 Generalized gingival recession, unspecified: Secondary | ICD-10-CM | POA: Diagnosis present

## 2019-04-27 DIAGNOSIS — H4311 Vitreous hemorrhage, right eye: Secondary | ICD-10-CM | POA: Diagnosis present

## 2019-04-27 DIAGNOSIS — M19011 Primary osteoarthritis, right shoulder: Secondary | ICD-10-CM

## 2019-04-27 DIAGNOSIS — Z981 Arthrodesis status: Secondary | ICD-10-CM

## 2019-04-27 DIAGNOSIS — T07XXXA Unspecified multiple injuries, initial encounter: Secondary | ICD-10-CM

## 2019-04-27 DIAGNOSIS — K045 Chronic apical periodontitis: Secondary | ICD-10-CM | POA: Diagnosis present

## 2019-04-27 DIAGNOSIS — S82141A Displaced bicondylar fracture of right tibia, initial encounter for closed fracture: Secondary | ICD-10-CM | POA: Diagnosis present

## 2019-04-27 DIAGNOSIS — S0285XD Fracture of orbit, unspecified, subsequent encounter for fracture with routine healing: Secondary | ICD-10-CM | POA: Diagnosis not present

## 2019-04-27 DIAGNOSIS — S0591XA Unspecified injury of right eye and orbit, initial encounter: Secondary | ICD-10-CM

## 2019-04-27 DIAGNOSIS — K001 Supernumerary teeth: Secondary | ICD-10-CM | POA: Diagnosis present

## 2019-04-27 DIAGNOSIS — S82141S Displaced bicondylar fracture of right tibia, sequela: Secondary | ICD-10-CM | POA: Diagnosis not present

## 2019-04-27 DIAGNOSIS — F419 Anxiety disorder, unspecified: Secondary | ICD-10-CM | POA: Diagnosis present

## 2019-04-27 DIAGNOSIS — F4323 Adjustment disorder with mixed anxiety and depressed mood: Secondary | ICD-10-CM

## 2019-04-27 DIAGNOSIS — M792 Neuralgia and neuritis, unspecified: Secondary | ICD-10-CM | POA: Diagnosis present

## 2019-04-27 DIAGNOSIS — S72402B Unspecified fracture of lower end of left femur, initial encounter for open fracture type I or II: Principal | ICD-10-CM

## 2019-04-27 DIAGNOSIS — D72829 Elevated white blood cell count, unspecified: Secondary | ICD-10-CM | POA: Diagnosis present

## 2019-04-27 DIAGNOSIS — H539 Unspecified visual disturbance: Secondary | ICD-10-CM

## 2019-04-27 DIAGNOSIS — G8918 Other acute postprocedural pain: Secondary | ICD-10-CM

## 2019-04-27 DIAGNOSIS — R7303 Prediabetes: Secondary | ICD-10-CM | POA: Diagnosis present

## 2019-04-27 DIAGNOSIS — T1490XA Injury, unspecified, initial encounter: Secondary | ICD-10-CM | POA: Insufficient documentation

## 2019-04-27 DIAGNOSIS — S022XXD Fracture of nasal bones, subsequent encounter for fracture with routine healing: Secondary | ICD-10-CM | POA: Diagnosis not present

## 2019-04-27 DIAGNOSIS — S0219XD Other fracture of base of skull, subsequent encounter for fracture with routine healing: Secondary | ICD-10-CM | POA: Diagnosis not present

## 2019-04-27 DIAGNOSIS — X58XXXA Exposure to other specified factors, initial encounter: Secondary | ICD-10-CM | POA: Diagnosis present

## 2019-04-27 DIAGNOSIS — K5901 Slow transit constipation: Secondary | ICD-10-CM | POA: Diagnosis not present

## 2019-04-27 DIAGNOSIS — G3184 Mild cognitive impairment, so stated: Secondary | ICD-10-CM | POA: Diagnosis present

## 2019-04-27 DIAGNOSIS — K083 Retained dental root: Secondary | ICD-10-CM | POA: Diagnosis present

## 2019-04-27 DIAGNOSIS — S82141D Displaced bicondylar fracture of right tibia, subsequent encounter for closed fracture with routine healing: Secondary | ICD-10-CM | POA: Diagnosis not present

## 2019-04-27 LAB — CBC
HCT: 36.1 % — ABNORMAL LOW (ref 39.0–52.0)
Hemoglobin: 11.6 g/dL — ABNORMAL LOW (ref 13.0–17.0)
MCH: 28.8 pg (ref 26.0–34.0)
MCHC: 32.1 g/dL (ref 30.0–36.0)
MCV: 89.6 fL (ref 80.0–100.0)
Platelets: 285 10*3/uL (ref 150–400)
RBC: 4.03 MIL/uL — ABNORMAL LOW (ref 4.22–5.81)
RDW: 13.2 % (ref 11.5–15.5)
WBC: 11.7 10*3/uL — ABNORMAL HIGH (ref 4.0–10.5)
nRBC: 0 % (ref 0.0–0.2)

## 2019-04-27 LAB — BASIC METABOLIC PANEL
Anion gap: 13 (ref 5–15)
BUN: 15 mg/dL (ref 6–20)
CO2: 25 mmol/L (ref 22–32)
Calcium: 9 mg/dL (ref 8.9–10.3)
Chloride: 97 mmol/L — ABNORMAL LOW (ref 98–111)
Creatinine, Ser: 1.06 mg/dL (ref 0.61–1.24)
GFR calc Af Amer: >60 mL/min (ref >60)
GFR calc non Af Amer: >60 mL/min (ref >60)
Glucose, Bld: 114 mg/dL — ABNORMAL HIGH (ref 70–99)
Potassium: 4.1 mmol/L (ref 3.5–5.1)
Sodium: 135 mmol/L (ref 135–145)

## 2019-04-27 LAB — MAGNESIUM: Magnesium: 2.2 mg/dL (ref 1.7–2.4)

## 2019-04-27 LAB — PHOSPHORUS: Phosphorus: 4.3 mg/dL (ref 2.5–4.6)

## 2019-04-27 IMAGING — DX DG SHOULDER 2+V*R*
3 series · 3 of 3 positions shown · non-contrast
Comparison: None.

CLINICAL DATA: Right shoulder pain

EXAM:
RIGHT SHOULDER - 2+ VIEW

[x shoulder ap right (1 of 2)]
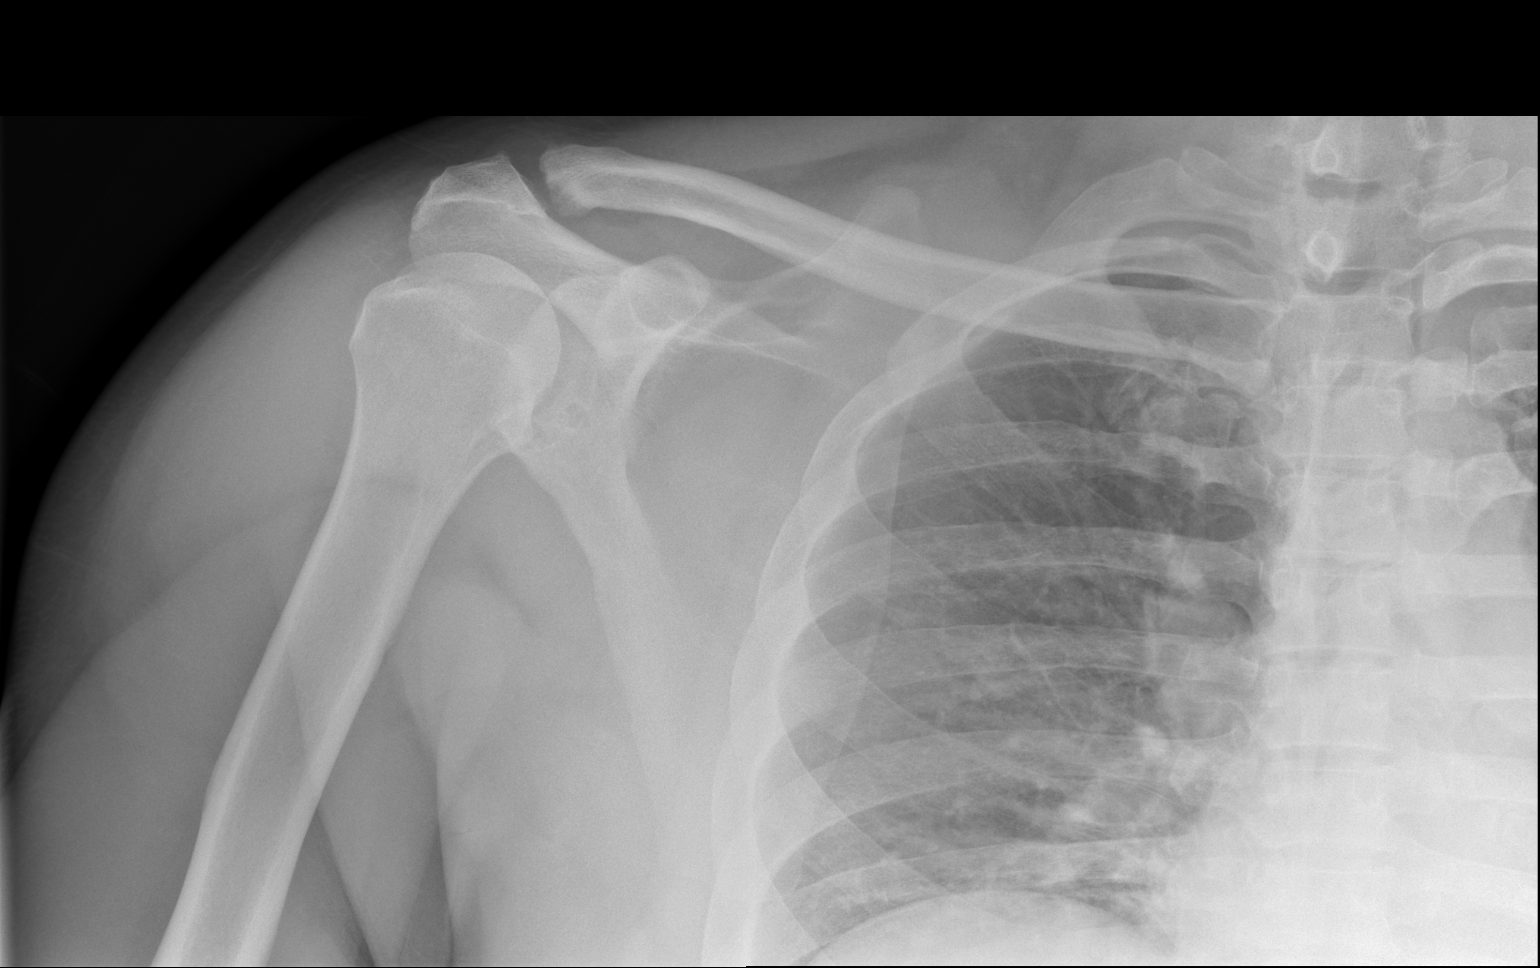

[x shoulder ap right (2 of 2)]
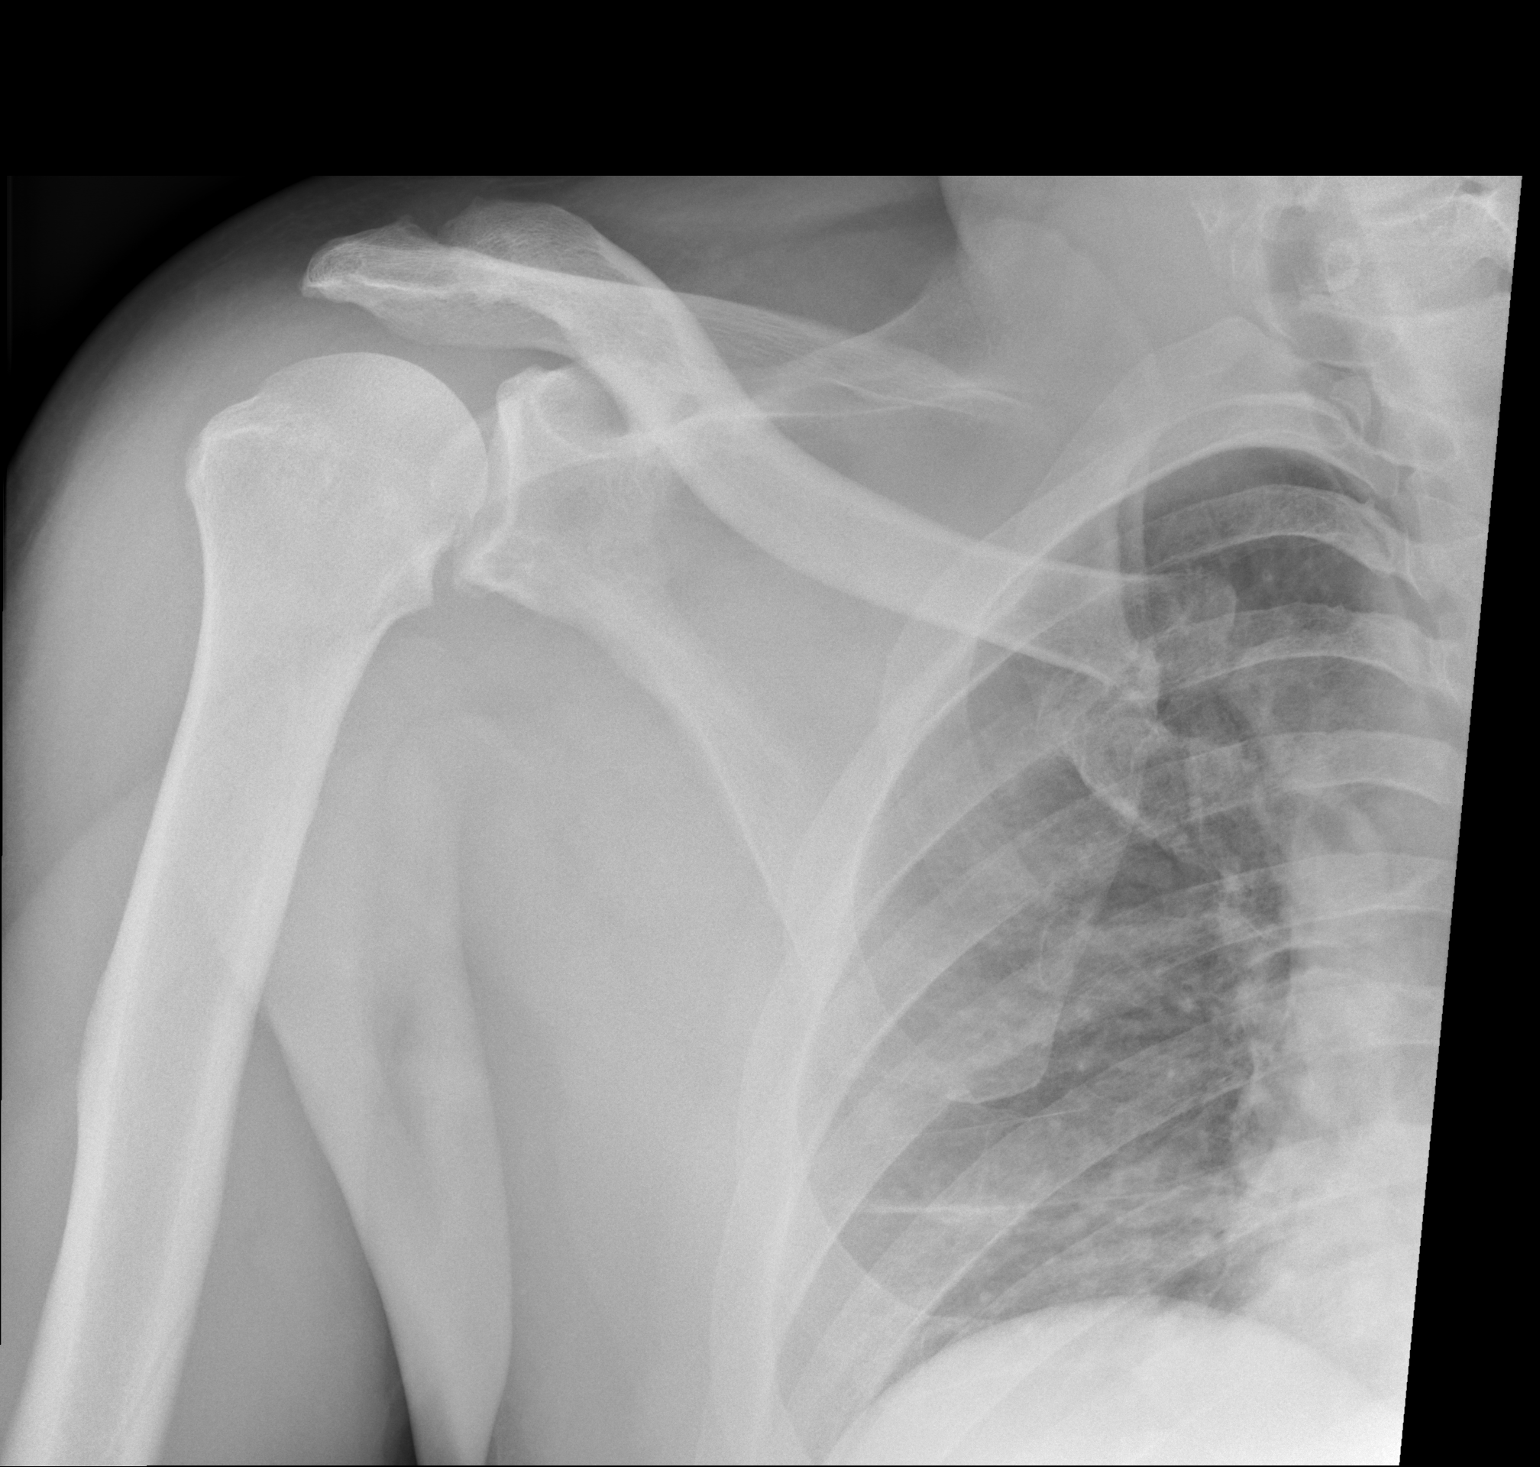

[x scapula y-view right]
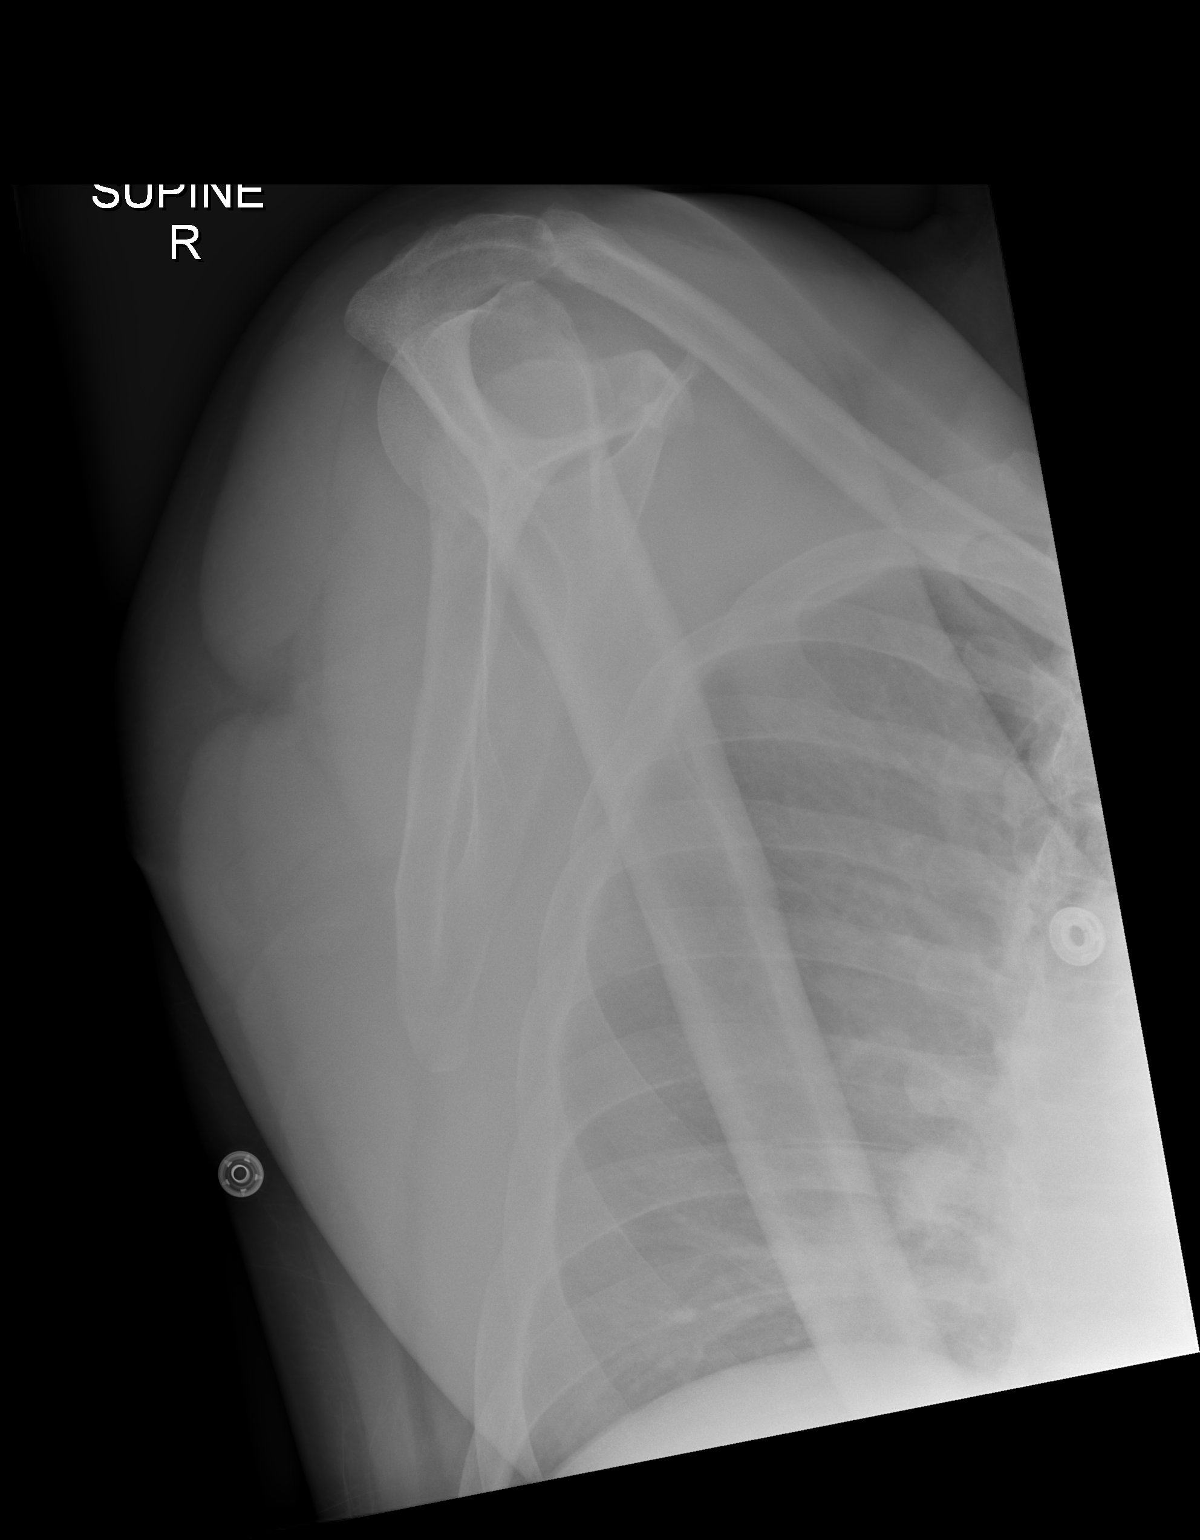

[3 of 3 positions shown; findings below may reference images not displayed]

FINDINGS: No acute fracture or dislocation of the right shoulder. There is
moderate glenohumeral arthrosis with multiple subchondral cysts and
irregularity of the anterior inferior margin of the glenoid, as well
as a possible Hill-Sachs deformity of the right humeral head. Mild
acromioclavicular arthrosis. The partially imaged right chest is
unremarkable.
IMPRESSION: 1. No acute fracture or dislocation of the right shoulder.

2. There is moderate glenohumeral arthrosis with multiple
subchondral cysts and irregularity of the anterior inferior margin
of the glenoid, as well as a possible Hill-Sachs deformity of the
right humeral head. Findings suggest chronic sequelae of
prior/recurrent anterior shoulder dislocations.

3.  Mild acromioclavicular arthrosis.

## 2019-04-27 MED ORDER — GABAPENTIN 100 MG PO CAPS
100.0000 mg | ORAL_CAPSULE | Freq: Three times a day (TID) | ORAL | Status: DC
  Administered 2019-04-27 – 2019-04-30 (×9): 100 mg via ORAL
  Filled 2019-04-27 (×9): qty 1

## 2019-04-27 MED ORDER — ALUM & MAG HYDROXIDE-SIMETH 200-200-20 MG/5ML PO SUSP: 30.0000 mL | ORAL | Status: DC | PRN

## 2019-04-27 MED ORDER — TRAZODONE HCL 50 MG PO TABS: 50.0000 mg | ORAL_TABLET | Freq: Every day | ORAL | Status: DC

## 2019-04-27 MED ORDER — NAPHAZOLINE-GLYCERIN 0.012-0.2 % OP SOLN
2.0000 [drp] | Freq: Three times a day (TID) | OPHTHALMIC | Status: DC
  Administered 2019-04-27 – 2019-05-07 (×33): 2 [drp] via OPHTHALMIC
  Filled 2019-04-27 (×2): qty 15

## 2019-04-27 MED ORDER — METHOCARBAMOL 500 MG PO TABS
1000.0000 mg | ORAL_TABLET | Freq: Three times a day (TID) | ORAL | Status: DC
  Administered 2019-04-27 – 2019-05-04 (×20): 1000 mg via ORAL
  Filled 2019-04-27 (×20): qty 2

## 2019-04-27 MED ORDER — CHLORHEXIDINE GLUCONATE CLOTH 2 % EX PADS
6.0000 | MEDICATED_PAD | Freq: Every day | CUTANEOUS | Status: DC
  Administered 2019-04-28 – 2019-04-30 (×2): 6 via TOPICAL

## 2019-04-27 MED ORDER — OXYCODONE HCL 5 MG PO TABS
5.0000 mg | ORAL_TABLET | ORAL | Status: DC | PRN
  Administered 2019-04-27 – 2019-05-06 (×13): 10 mg via ORAL
  Filled 2019-04-27 (×14): qty 2

## 2019-04-27 MED ORDER — DIPHENHYDRAMINE HCL 12.5 MG/5ML PO ELIX: 12.5000 mg | ORAL_SOLUTION | Freq: Four times a day (QID) | ORAL | Status: DC | PRN

## 2019-04-27 MED ORDER — PROCHLORPERAZINE EDISYLATE 10 MG/2ML IJ SOLN: 5.0000 mg | Freq: Four times a day (QID) | INTRAMUSCULAR | Status: DC | PRN

## 2019-04-27 MED ORDER — POLYETHYLENE GLYCOL 3350 17 G PO PACK
17.0000 g | PACK | Freq: Every day | ORAL | Status: DC
  Administered 2019-04-28: 17 g via ORAL
  Filled 2019-04-27 (×2): qty 1

## 2019-04-27 MED ORDER — FLEET ENEMA 7-19 GM/118ML RE ENEM: 1.0000 | ENEMA | Freq: Once | RECTAL | Status: DC | PRN

## 2019-04-27 MED ORDER — ENOXAPARIN SODIUM 30 MG/0.3ML ~~LOC~~ SOLN
30.0000 mg | Freq: Two times a day (BID) | SUBCUTANEOUS | Status: DC
  Administered 2019-04-27 – 2019-05-08 (×22): 30 mg via SUBCUTANEOUS
  Filled 2019-04-27 (×22): qty 0.3

## 2019-04-27 MED ORDER — TRAZODONE HCL 50 MG PO TABS
50.0000 mg | ORAL_TABLET | Freq: Every day | ORAL | Status: DC
  Administered 2019-04-27 – 2019-05-07 (×11): 50 mg via ORAL
  Filled 2019-04-27 (×11): qty 1

## 2019-04-27 MED ORDER — POLYETHYLENE GLYCOL 3350 17 G PO PACK
17.0000 g | PACK | Freq: Every day | ORAL | Status: DC | PRN
  Administered 2019-04-27 – 2019-04-29 (×2): 17 g via ORAL
  Filled 2019-04-27: qty 1

## 2019-04-27 MED ORDER — DOCUSATE SODIUM 100 MG PO CAPS
100.0000 mg | ORAL_CAPSULE | Freq: Two times a day (BID) | ORAL | Status: DC
  Administered 2019-04-27 – 2019-04-28 (×3): 100 mg via ORAL
  Filled 2019-04-27 (×4): qty 1

## 2019-04-27 MED ORDER — TRAMADOL HCL 50 MG PO TABS
50.0000 mg | ORAL_TABLET | Freq: Four times a day (QID) | ORAL | Status: DC | PRN
  Administered 2019-05-03 – 2019-05-04 (×2): 50 mg via ORAL
  Filled 2019-04-27 (×2): qty 1

## 2019-04-27 MED ORDER — ENSURE ENLIVE PO LIQD
237.0000 mL | Freq: Two times a day (BID) | ORAL | Status: DC
  Administered 2019-04-28 – 2019-05-02 (×7): 237 mL via ORAL
  Filled 2019-04-27 (×2): qty 237

## 2019-04-27 MED ORDER — BISACODYL 10 MG RE SUPP: 10.0000 mg | Freq: Every day | RECTAL | Status: DC | PRN

## 2019-04-27 MED ORDER — ACETAMINOPHEN 325 MG PO TABS
325.0000 mg | ORAL_TABLET | ORAL | Status: DC | PRN
  Administered 2019-04-29 – 2019-05-07 (×3): 650 mg via ORAL
  Filled 2019-04-27 (×3): qty 2

## 2019-04-27 MED ORDER — GUAIFENESIN-DM 100-10 MG/5ML PO SYRP: 5.0000 mL | ORAL_SOLUTION | Freq: Four times a day (QID) | ORAL | Status: DC | PRN

## 2019-04-27 MED ORDER — PROCHLORPERAZINE MALEATE 5 MG PO TABS: 5.0000 mg | ORAL_TABLET | Freq: Four times a day (QID) | ORAL | Status: DC | PRN

## 2019-04-27 MED ORDER — PROCHLORPERAZINE 25 MG RE SUPP: 12.5000 mg | Freq: Four times a day (QID) | RECTAL | Status: DC | PRN

## 2019-04-27 NOTE — Progress Notes (Signed)
Transferred to Rehab unit via bed. Report given to [facility]
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
## 2019-04-27 NOTE — Progress Notes (Signed)
Inpatient Rehabilitation-Admissions Coordinator   I have received insurance approval and medical clearance from attending service for admit to CIR today. Pt is on board with pursing CIR today. We reviewed insurance letter and consent form.   AC will update RN, CM/SW regarding plan for admit today.   Please call if questions.   Jhonnie Garner, OTR/L  Rehab Admissions Coordinator  [phone] 04/27/2019 11:30 AM
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
## 2019-04-27 NOTE — Progress Notes (Signed)
Patient ID: Eric [name], male   DOB: [birth year], 48 y.o.   MRN: [id number] Patient was admitted to 4W20 via bed, escorted by nursing staff and spouse.  Patient verbalized understanding of rehab process including fall prevention policy, personal belongings policy, and visitation policy.  Patient appears to be in no immediate distress at this time.  Brita Romp, RN
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
## 2019-04-28 ENCOUNTER — Inpatient Hospital Stay (HOSPITAL_COMMUNITY): Payer: Self-pay

## 2019-04-28 ENCOUNTER — Inpatient Hospital Stay (HOSPITAL_COMMUNITY): Payer: Self-pay | Admitting: Occupational Therapy

## 2019-04-28 DIAGNOSIS — G8918 Other acute postprocedural pain: Secondary | ICD-10-CM

## 2019-04-28 DIAGNOSIS — D62 Acute posthemorrhagic anemia: Secondary | ICD-10-CM

## 2019-04-28 DIAGNOSIS — R7303 Prediabetes: Secondary | ICD-10-CM

## 2019-04-28 DIAGNOSIS — T07XXXA Unspecified multiple injuries, initial encounter: Secondary | ICD-10-CM

## 2019-04-28 DIAGNOSIS — M19011 Primary osteoarthritis, right shoulder: Secondary | ICD-10-CM

## 2019-04-28 LAB — CBC WITH DIFFERENTIAL/PLATELET
Abs Immature Granulocytes: 0.12 10*3/uL — ABNORMAL HIGH (ref 0.00–0.07)
Basophils Absolute: 0 10*3/uL (ref 0.0–0.1)
Basophils Relative: 0 %
Eosinophils Absolute: 0.2 10*3/uL (ref 0.0–0.5)
Eosinophils Relative: 2 %
HCT: 35.6 % — ABNORMAL LOW (ref 39.0–52.0)
Hemoglobin: 11.9 g/dL — ABNORMAL LOW (ref 13.0–17.0)
Immature Granulocytes: 1 %
Lymphocytes Relative: 14 %
Lymphs Abs: 1.5 10*3/uL (ref 0.7–4.0)
MCH: 29.5 pg (ref 26.0–34.0)
MCHC: 33.4 g/dL (ref 30.0–36.0)
MCV: 88.1 fL (ref 80.0–100.0)
Monocytes Absolute: 1 10*3/uL (ref 0.1–1.0)
Monocytes Relative: 9 %
Neutro Abs: 7.5 10*3/uL (ref 1.7–7.7)
Neutrophils Relative %: 74 %
Platelets: 285 10*3/uL (ref 150–400)
RBC: 4.04 MIL/uL — ABNORMAL LOW (ref 4.22–5.81)
RDW: 13.1 % (ref 11.5–15.5)
WBC: 10.2 10*3/uL (ref 4.0–10.5)
nRBC: 0 % (ref 0.0–0.2)

## 2019-04-28 LAB — COMPREHENSIVE METABOLIC PANEL
ALT: 88 U/L — ABNORMAL HIGH (ref 0–44)
AST: 39 U/L (ref 15–41)
Albumin: 3 g/dL — ABNORMAL LOW (ref 3.5–5.0)
Alkaline Phosphatase: 114 U/L (ref 38–126)
Anion gap: 10 (ref 5–15)
BUN: 16 mg/dL (ref 6–20)
CO2: 27 mmol/L (ref 22–32)
Calcium: 9 mg/dL (ref 8.9–10.3)
Chloride: 100 mmol/L (ref 98–111)
Creatinine, Ser: 1.02 mg/dL (ref 0.61–1.24)
GFR calc Af Amer: >60 mL/min (ref >60)
GFR calc non Af Amer: >60 mL/min (ref >60)
Glucose, Bld: 109 mg/dL — ABNORMAL HIGH (ref 70–99)
Potassium: 4.3 mmol/L (ref 3.5–5.1)
Sodium: 137 mmol/L (ref 135–145)
Total Bilirubin: 0.7 mg/dL (ref 0.3–1.2)
Total Protein: 6.6 g/dL (ref 6.5–8.1)

## 2019-04-28 LAB — HEMOGLOBIN A1C
Hgb A1c MFr Bld: 5.7 % — ABNORMAL HIGH (ref 4.8–5.6)
Mean Plasma Glucose: 116.89 mg/dL

## 2019-04-28 MED ORDER — DICLOFENAC SODIUM 1 % TD GEL
2.0000 g | Freq: Four times a day (QID) | TRANSDERMAL | Status: DC
  Administered 2019-04-28 – 2019-05-08 (×37): 2 g via TOPICAL
  Filled 2019-04-28 (×2): qty 100

## 2019-04-28 NOTE — Progress Notes (Signed)
Inpatient Rehabilitation  Patient information reviewed and entered into eRehab system by Santiago Stenzel M. Catalia Massett, M.A., CCC/SLP, PPS Coordinator.  Information including medical coding, functional ability and quality indicators will be reviewed and updated through discharge.    

## 2019-04-28 NOTE — Care Management Note (Signed)
[facility] Individual Statement of Services  Patient Name:  Eric [name]  Date:  04/28/2019  Welcome to the [facility].  Our goal is to provide you with an individualized program based on your diagnosis and situation, designed to meet your specific needs.  With this comprehensive rehabilitation program, you will be expected to participate in at least 3 hours of rehabilitation therapies Monday-Friday, with modified therapy programming on the weekends.  Your rehabilitation program will include the following services:  Physical Therapy (PT), Occupational Therapy (OT), 24 hour per day rehabilitation nursing, Therapeutic Recreaction (TR), Neuropsychology, Case Management (Social Worker), Rehabilitation Medicine, Nutrition Services and Pharmacy Services  Weekly team conferences will be held on Wednesday to discuss your progress.  Your Social Worker will talk with you frequently to get your input and to update you on team discussions.  Team conferences with you and your family in attendance may also be held.  Expected length of stay: 14-16 days Overall anticipated outcome: supervision-CGA level  Depending on your progress and recovery, your program may change. Your Social Worker will coordinate services and will keep you informed of any changes. Your Social Worker's name and contact numbers are listed  below.  The following services may also be recommended but are not provided by the [place] [place] will be made to provide these services after discharge if needed.  Arrangements include referral to agencies that provide these services.  Your insurance has been verified to be:  [place] Your primary doctor is:  None  Pertinent information will be shared with your doctor and your insurance  company.  Social Worker:  Ovidio Kin, [place] or ([phone]  Information discussed with and copy given to patient by: Elease Hashimoto, 04/28/2019, 1:45 PM
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
## 2019-04-29 ENCOUNTER — Inpatient Hospital Stay (HOSPITAL_COMMUNITY): Payer: Managed Care, Other (non HMO) | Admitting: Occupational Therapy

## 2019-04-29 ENCOUNTER — Inpatient Hospital Stay (HOSPITAL_COMMUNITY): Payer: Managed Care, Other (non HMO)

## 2019-04-29 ENCOUNTER — Inpatient Hospital Stay (HOSPITAL_COMMUNITY): Payer: Managed Care, Other (non HMO) | Admitting: Speech Pathology

## 2019-04-29 DIAGNOSIS — K5903 Drug induced constipation: Secondary | ICD-10-CM

## 2019-04-29 DIAGNOSIS — F4323 Adjustment disorder with mixed anxiety and depressed mood: Secondary | ICD-10-CM

## 2019-04-29 MED ORDER — POLYETHYLENE GLYCOL 3350 17 G PO PACK
17.0000 g | PACK | Freq: Every day | ORAL | Status: DC
  Administered 2019-04-30 – 2019-05-02 (×3): 17 g via ORAL
  Filled 2019-04-29 (×5): qty 1

## 2019-04-29 MED ORDER — SENNOSIDES-DOCUSATE SODIUM 8.6-50 MG PO TABS
2.0000 | ORAL_TABLET | Freq: Every day | ORAL | Status: DC
  Administered 2019-04-29 – 2019-05-04 (×5): 2 via ORAL
  Filled 2019-04-29 (×7): qty 2
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
## 2019-04-29 NOTE — Progress Notes (Signed)
Physical Therapy Session Note  Patient Details  Name: Eric [name] MRN: [id number] Date of Birth: May 16, 1971  Today's Date: 04/29/2019 PT Individual Time: 0900-1015 PT Individual Time Calculation (min): 75 min   Short Term Goals: Week 1:  PT Short Term Goal 1 (Week 1): Pt will perform bed mobility CGA PT Short Term Goal 2 (Week 1): Pt will transfer sit<>stand with LRAD min A PT Short Term Goal 3 (Week 1): Pt will transfer bed<>chair CGA  Skilled Therapeutic Interventions/Progress Updates:   Received pt supine in bed, pt agreeable to PT, and states pain 4/10 in face. Pt reports R eye continues to hurt when he looks upwards and laterally. RN present administering medications. Session focused on functional mobility/transfers, LE strengthening exercises, bed mobility, balance/coordination, and improved tolerance to activity. Pt rolled to R CGA with HOB elevated and using bedrails. Pt transferred R sidelying<>sitting EOB CGA with use of bedrails. Pt performed chair<>bed transfer using slideboard x1 trial and scoot pivot transfer x 2 trials throughout session min A. Pt performed bed mobility on mat sit<>supine min A for LE management, supine<>sit mod A for trunk control and LE management.   In supine pt performed the following exercises: -Bilateral heel slides 2x10, verbal cues to remain in pain free range -Bilateral isometric quadriceps hold 2x10 with 3 second hold   -Bilateral SLR 2x5 with active assist on LLE, verbal cues for deep breathing and slow controlled movements. -Bilateral hip abduction 2x10 with pillowcase underneath heel for assist.  -Pelvic tilts 2x10 with therapist's hand underneath pt's back for tactile cueing -Bridging 2x10 with bolster underneath bilateral LE's for comfort  Pt performed sit<>stand x5 with RW from 22in elevated mat min A, then x5 reps from 21in elevated mat min A, and x5 reps from 20in elevated mat mod A. Pt overjoyed and emotional at end of session for  completing sit<>stand. Concluded session with pt supine in bed, needs within reach, bed alarm on.   Therapy Documentation Precautions:  Precautions Precautions: Fall Precaution Comments: LLE NWB, RLE WBAT, R bledsoe brace knee unlocked to allow flexion Required Braces or Orthoses: Other Brace(R bledsoe brace knee unlocked to allow flexion) Knee Immobilizer - Right: On at all times Other Brace: R bledsoe brace knee unlocked to allow flexion Restrictions Weight Bearing Restrictions: Yes RLE Weight Bearing: Weight bearing as tolerated LLE Weight Bearing: Non weight bearing  Therapy/Group: Individual Therapy  Alfonse Alpers PT, DPT   04/29/2019, 7:38 AM
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
## 2019-04-29 NOTE — Progress Notes (Signed)
Occupational Therapy Session Note  Patient Details  Name: Eric [name] MRN: [id number] Date of Birth: [birth year]  Today's Date: 04/29/2019 OT Individual Time: 1330-1430 OT Individual Time Calculation (min): 60 min    Short Term Goals: Week 1:  OT Short Term Goal 1 (Week 1): Pt will transfer to BSC/ toilet with min A with extra time OT Short Term Goal 2 (Week 1): Pt will don LB clothing with AE prn (including footwear) with mod A OT Short Term Goal 3 (Week 1): Pt will perform sit to stands with min A in prep for clothing management  Skilled Therapeutic Interventions/Progress Updates:    Patient asleep in bed, aroused with min stim.  He is pleasant and cooperative t/o session.  Pain in right shoulder noted.  Completed shoulder mobilization in supine position with some relief.  Supine to sit with min A.  Sit pivot transfer bed to w/c with CGA and cues for safety.  Completed shoulder closed chain mobility activities supported on table top with good tolerance and able to achieve greater than 90 flexion with forward lean.  Completed scapular mobility and stability activities in seated position in w/c - following these activities he was able to flex and abduct to 90 with less pain.  Completed trunk mobility activities and encouraged back support with blanket or pillow in w/c to improve positioning.  Provided eye patch for use prn as lighting in therapy gym increases tearing and pain.  Sit pivot transfer back to bed with min A.  Sit to stand at edge of bed with min A to allow for bed pad to be straightened - able to maintain NWB left LE.  SSP to supine with mod A.  Patient notes that his shoulder feels better - provided ice pack.  Bed alarm set and call bell in reach.    Therapy Documentation Precautions:  Precautions Precautions: Fall Precaution Comments: LLE NWB, RLE WBAT, R bledsoe brace knee unlocked to allow flexion Required Braces or Orthoses: Other Brace(R bledsoe brace knee unlocked to allow  flexion) Knee Immobilizer - Right: On at all times Other Brace: R bledsoe brace knee unlocked to allow flexion Restrictions Weight Bearing Restrictions: Yes RLE Weight Bearing: Weight bearing as tolerated LLE Weight Bearing: Non weight bearing General:   Vital Signs: Therapy Vitals Temp: 98.7 F (37.1 C) Pulse Rate: 92 Resp: 20 BP: 120/78 Patient Position (if appropriate): Lying Oxygen Therapy SpO2: 99 % Pain: Pain Assessment Pain Scale: 0-10 Pain Score: 6  Pain Type: Acute pain Pain Location: Shoulder Pain Orientation: Right Pain Descriptors / Indicators: Aching Pain Frequency: Intermittent Pain Onset: On-going Pain Intervention(s): Repositioned;Cold applied Other Treatments:     Therapy/Group: Individual Therapy  Carlos Levering 04/29/2019, 3:44 PM

## 2019-04-30 ENCOUNTER — Inpatient Hospital Stay (HOSPITAL_COMMUNITY): Payer: Managed Care, Other (non HMO) | Admitting: Speech Pathology

## 2019-04-30 ENCOUNTER — Inpatient Hospital Stay (HOSPITAL_COMMUNITY): Payer: Managed Care, Other (non HMO)

## 2019-04-30 ENCOUNTER — Inpatient Hospital Stay (HOSPITAL_COMMUNITY): Payer: Managed Care, Other (non HMO) | Admitting: Occupational Therapy

## 2019-04-30 MED ORDER — BENZOCAINE 10 % MT GEL
Freq: Four times a day (QID) | OROMUCOSAL | Status: DC | PRN
  Filled 2019-04-30: qty 9

## 2019-04-30 MED ORDER — GABAPENTIN 300 MG PO CAPS
300.0000 mg | ORAL_CAPSULE | Freq: Every day | ORAL | Status: DC
  Administered 2019-04-30 – 2019-05-02 (×3): 300 mg via ORAL
  Filled 2019-04-30 (×3): qty 1

## 2019-04-30 MED ORDER — GABAPENTIN 100 MG PO CAPS
200.0000 mg | ORAL_CAPSULE | Freq: Two times a day (BID) | ORAL | Status: DC
  Administered 2019-04-30 – 2019-05-03 (×6): 200 mg via ORAL
  Filled 2019-04-30 (×6): qty 2
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
## 2019-04-30 NOTE — Progress Notes (Signed)
Occupational Therapy Session Note  Patient Details  Name: Eric [name] MRN: [id number] Date of Birth: 11-14-1970  Today's Date: 04/30/2019 OT Individual Time: 6144-3154 OT Individual Time Calculation (min): 72 min    Short Term Goals: Week 1:  OT Short Term Goal 1 (Week 1): Pt will transfer to BSC/ toilet with min A with extra time OT Short Term Goal 2 (Week 1): Pt will don LB clothing with AE prn (including footwear) with mod A OT Short Term Goal 3 (Week 1): Pt will perform sit to stands with min A in prep for clothing management  Skilled Therapeutic Interventions/Progress Updates:    Treatment session with focus on self-care retraining with functional transfers, sit <> stand, and use of AE to increase independence with LB dressing.  Pt received upright in bed finishing breakfast.  RN in to provide morning meds and pain meds at beginning of session.  Pt completed bed mobility with CGA.  Lateral scoot transfer bed > w/c with Min assist while therapist providing tactile cues and cues to maintain NWB through LLE.  Engaged in bathing seated at sink with pt able to doff pants with lateral leans with increased time.  Engaged in sit > stand for LB bathing. Mod assist sit > stand with pt expressing anxious with movement and fear of falling.  Pt unable to release BUE support from sink, therefore therapist washed buttocks while pt maintained standing.  Educated on use of reacher for LB dressing with therapist demonstrating technique and then pt able to complete with assist for setup and when threading LLE.  Sit > partial stand with pt pushing up from w/c arm rests to allow therapist to pull pants over hips. Pt remained upright in w/c with seat belt alarm on and all needs in reach.   Therapy Documentation Precautions:  Precautions Precautions: Fall Precaution Comments: LLE NWB, RLE WBAT, R bledsoe brace knee unlocked to allow flexion Required Braces or Orthoses: Other Brace(R bledsoe brace knee  unlocked to allow flexion) Knee Immobilizer - Right: On at all times Other Brace: R bledsoe brace knee unlocked to allow flexion Restrictions Weight Bearing Restrictions: Yes RLE Weight Bearing: Weight bearing as tolerated LLE Weight Bearing: Non weight bearing General:   Vital Signs: Therapy Vitals Temp: 98.3 F (36.8 C) Temp Source: Oral Pulse Rate: 85 Resp: 19 BP: 117/67 Patient Position (if appropriate): Lying Oxygen Therapy SpO2: 98 % O2 Device: Room Air Pain: Pain Assessment Pain Scale: 0-10 Pain Score: 6  Pain Type: Acute pain Pain Location: Face Pain Orientation: Right;Left Pain Descriptors / Indicators: Aching Pain Frequency: Constant Pain Onset: On-going Patients Stated Pain Goal: 2 Pain Intervention(s): Medication (See eMAR)   Therapy/Group: Individual Therapy  Simonne Come 04/30/2019, 8:44 AM

## 2019-04-30 NOTE — Progress Notes (Signed)
Social Work Patient ID: Eric [name], male   DOB: [birth year], 48 y.o.   MRN: [id number] Have faxed short term disability forms and FMLA forms to [employer] the originals back to pt and girlfriend. Pt reports doing a little better today. Will see neuro-psych on Monday.

## 2019-04-30 NOTE — Progress Notes (Signed)
Speech Language Pathology Daily Session Note  Patient Details  Name: Eric [name] MRN: [id number] Date of Birth: 09-Jun-1971  Today's Date: 04/30/2019 SLP Individual Time: 1300-1330 SLP Individual Time Calculation (min): 30 min  Short Term Goals: Week 1: SLP Short Term Goal 1 (Week 1): Pt will demonstrate functional problem solving for familiar mildly complex tasks with Min A verbal/visual cues. SLP Short Term Goal 2 (Week 1): Pt will complete functional math activities with Min A verbal/visual cues. SLP Short Term Goal 3 (Week 1): Pt will recall basic daily and/or new information with Min A cues for use of compensatory memory strategies/aids.  Skilled Therapeutic Interventions:  Skilled treatment session focused on cognitive goals. SLP facilitated session by providing extra time and supervision level verbal cues for functional problem solving during a basic money and medication management tasks. Patient left upright in bed with alarm on and all needs within reach. Continue with current plan of care.      Pain No/Denies Pain   Therapy/Group: Individual Therapy  Eric [name] 04/30/2019, 2:45 PM
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
## 2019-04-30 NOTE — Progress Notes (Signed)
Physical Therapy Session Note  Patient Details  Name: Eric [name] MRN: [id number] Date of Birth: [birth year]  Today's Date: 04/30/2019 PT Individual Time: 0950-1106 PT Individual Time Calculation (min): 76 min   Short Term Goals: Week 1:  PT Short Term Goal 1 (Week 1): Pt will perform bed mobility CGA PT Short Term Goal 2 (Week 1): Pt will transfer sit<>stand with LRAD min A PT Short Term Goal 3 (Week 1): Pt will transfer bed<>chair CGA Week 2:    Week 3:     Skilled Therapeutic Interventions/Progress Updates:      Therapy Documentation Precautions:  Precautions Precautions: Fall Precaution Comments: LLE NWB, RLE WBAT, R bledsoe brace knee unlocked to allow flexion Required Braces or Orthoses: Other Brace(R bledsoe brace knee unlocked to allow flexion) Knee Immobilizer - Right: On at all times Other Brace: R bledsoe brace knee unlocked to allow flexion Restrictions Weight Bearing Restrictions: Yes RLE Weight Bearing: Weight bearing as tolerated LLE Weight Bearing: Non weight bearing  PAIN 5/10 Le's, face 7/10 Pt initially OOB in wc.  wc propulsion 274ft mod I for UE stength/endurance.  Pt worked on STS in parallel bars. STS x 1 w/mod assist, NWB RLE.  In standing worked on L hip hike and L knee flexion to improve ability to maintain LLE nwbing.    Pt very fearfull of falling/anxious w/standing initially.  Encouraged to increase ant wt shift and use of momentum w/transition.  Repeated efforts pt able to perform STS from wc w/use of bars and momentum w/min assist and maintains NWB well.   Stood 3 min x 1, 58min x 1 and maintains NWB well.  Pt able to take 2steps in parallel bars w/cues and min assist, max assist w/backing due to L knee instability.  Pt requires several min recovery between each effort.  wc to/from mat lateral scoot w/setup and supervision. MaxiSky vest donned by therapist.  STS from elevated mat x 4. W/RW.  Attempted to take single step w/RW w/use of maxi ski to  decrease pt fear of falling, but pt unable to advance walker due to sense of RLE instability w/repeated attempts.  Pt transported to room.  wc to bed lateral scoot w/set up assist and cga.  Sit to supine mod I. Pt left supine w/rails up x 3, alarm set, bed in lowest position, and needs in reach.    Therapy/Group: Individual Therapy  Callie Fielding, PT   Jerrilyn Cairo 04/30/2019, 12:46 PM

## 2019-05-01 ENCOUNTER — Inpatient Hospital Stay (HOSPITAL_COMMUNITY): Payer: Managed Care, Other (non HMO) | Admitting: Occupational Therapy

## 2019-05-01 ENCOUNTER — Inpatient Hospital Stay (HOSPITAL_COMMUNITY): Payer: Managed Care, Other (non HMO)

## 2019-05-01 NOTE — Progress Notes (Signed)
Speech Language Pathology Daily Session Note  Patient Details  Name: Eric [name] MRN: [id number] Date of Birth: August 17, 1970  Today's Date: 05/01/2019 SLP Individual Time: 0277-4128 SLP Individual Time Calculation (min): 43 min  Short Term Goals: Week 1: SLP Short Term Goal 1 (Week 1): Pt will demonstrate functional problem solving for familiar mildly complex tasks with Min A verbal/visual cues. SLP Short Term Goal 2 (Week 1): Pt will complete functional math activities with Min A verbal/visual cues. SLP Short Term Goal 3 (Week 1): Pt will recall basic daily and/or new information with Min A cues for use of compensatory memory strategies/aids.  Skilled Therapeutic Interventions: Skilled ST services focused on cognitive skills. SLP facilitated semi-complex problem solving skills in account balancing task required min A verbal cues and in organizational/planning task (kitchen and closet) pt required supervision A verbal cues. Pt demonstrated use of note-taking strategies to aid in recall following initial instruction. Pt was left in room with call bell within reach and bed alarm set. ST recommends to continue skilled ST services.      Pain Pain Assessment Pain Scale: 0-10 Pain Score: 0-No pain Pain Location: Shoulder Pain Orientation: Right Pain Descriptors / Indicators: Aching Pain Intervention(s): Repositioned;Cold applied  Therapy/Group: Individual Therapy  Iyona Pehrson  [facility] 05/01/2019, 3:49 PM
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
## 2019-05-01 NOTE — Progress Notes (Signed)
Occupational Therapy Session Note  Patient Details  Name: Eric [name] MRN: [id number] Date of Birth: 29-Apr-1971  Today's Date: 05/01/2019 OT Individual Time: 9323-5573 OT Individual Time Calculation (min): 32 min    Short Term Goals: Week 1:  OT Short Term Goal 1 (Week 1): Pt will transfer to BSC/ toilet with min A with extra time OT Short Term Goal 2 (Week 1): Pt will don LB clothing with AE prn (including footwear) with mod A OT Short Term Goal 3 (Week 1): Pt will perform sit to stands with min A in prep for clothing management  Skilled Therapeutic Interventions/Progress Updates:    Patient in bed, alert, able to verbalize precautions.  Supine to SSP with CS.  Sit to stand and SPT with RW to w/c with CGA - able to maintain NWB left LE.  Completed isometric shoulder exercises (flex, ext, abd, add, ER, IR)  Noted mild pain in abd and ER.  Completed closed chain mobility activities with right UE - no pain.  Completed weight bearing activities with Right UE - no pain.  Patient remained in the w/c at close of session, ice pack provided, seat belt alarm set and call bell in reach, wife present.    Therapy Documentation Precautions:  Precautions Precautions: Fall Precaution Comments: LLE NWB, RLE WBAT, R bledsoe brace knee unlocked to allow flexion Required Braces or Orthoses: Other Brace(R bledsoe brace knee unlocked to allow flexion) Knee Immobilizer - Right: On at all times Other Brace: R bledsoe brace knee unlocked to allow flexion Restrictions Weight Bearing Restrictions: Yes RLE Weight Bearing: Weight bearing as tolerated LLE Weight Bearing: Non weight bearing General:   Vital Signs: Therapy Vitals Temp: 99.1 F (37.3 C) Temp Source: Oral Pulse Rate: 92 Resp: 18 BP: 118/76 Patient Position (if appropriate): Lying Oxygen Therapy SpO2: 99 % O2 Device: Room Air Pain: Pain Assessment Pain Scale: 0-10 Pain Score: 2  Pain Location: Shoulder Pain Orientation: Right Pain  Descriptors / Indicators: Tender Pain Intervention(s): Repositioned;Cold applied Other Treatments:     Therapy/Group: Individual Therapy  Carlos Levering 05/01/2019, 3:54 PM

## 2019-05-01 NOTE — Progress Notes (Signed)
Occupational Therapy Session Note  Patient Details  Name: Eric [name] MRN: [id number] Date of Birth: 08/01/1970  Today's Date: 05/01/2019 OT Individual Time: 1000-1045 OT Individual Time Calculation (min): 45 min    Short Term Goals: Week 1:  OT Short Term Goal 1 (Week 1): Pt will transfer to BSC/ toilet with min A with extra time OT Short Term Goal 2 (Week 1): Pt will don LB clothing with AE prn (including footwear) with mod A OT Short Term Goal 3 (Week 1): Pt will perform sit to stands with min A in prep for clothing management  Skilled Therapeutic Interventions/Progress Updates:    Patient supine in bed.  Alert and ready for therapy session.   Supine to/from SSP with CS.  Sit pivot transfer bed to/from w/c with CGA, cues for technique, good carryover of WB precautions.  Completed towel glide/shoulder stretches with arm supported on elevated table - scapular mobility with cues for retraction without elevation,  no pain with flexion (able to achieve at least 120), no pain with abd (to approx 120), ER, full horiz abduction in supported position with c/o stretch and mild pain end range.  Active ER with elbow supported to 90 without pain.  Attempt with Active abd against gravity with significant pain, AAROM into abd to approx 110 without pain but significant pain with return to neutral position.  AAROM into flexion 140 without pain - catches around 90 with decent.  Patient returned to bed at close of session, ice pack provided, bed alarm set and call bell in reach.    Therapy Documentation Precautions:  Precautions Precautions: Fall Precaution Comments: LLE NWB, RLE WBAT, R bledsoe brace knee unlocked to allow flexion Required Braces or Orthoses: Other Brace(R bledsoe brace knee unlocked to allow flexion) Knee Immobilizer - Right: On at all times Other Brace: R bledsoe brace knee unlocked to allow flexion Restrictions Weight Bearing Restrictions: Yes RLE Weight Bearing: Weight bearing as  tolerated LLE Weight Bearing: Non weight bearing General:   Vital Signs:   Pain: Pain Assessment Pain Scale: 0-10 Pain Score: 5  Pain Location: Shoulder Pain Orientation: Right Pain Descriptors / Indicators: Aching Pain Intervention(s): Repositioned;Cold applied   Therapy/Group: Individual Therapy  Carlos Levering 05/01/2019, 12:00 PM

## 2019-05-02 ENCOUNTER — Inpatient Hospital Stay (HOSPITAL_COMMUNITY): Payer: Managed Care, Other (non HMO)

## 2019-05-02 ENCOUNTER — Inpatient Hospital Stay (HOSPITAL_COMMUNITY): Payer: Managed Care, Other (non HMO) | Admitting: Speech Pathology

## 2019-05-02 ENCOUNTER — Inpatient Hospital Stay (HOSPITAL_COMMUNITY): Payer: Managed Care, Other (non HMO) | Admitting: Occupational Therapy

## 2019-05-02 NOTE — Progress Notes (Signed)
Occupational Therapy Session Note  Patient Details  Name: Eric [name] MRN: [id number] Date of Birth: 10/03/1970  Today's Date: 05/02/2019 OT Individual Time: 9937-1696 OT Individual Time Calculation (min): 57 min   Short Term Goals: Week 1:  OT Short Term Goal 1 (Week 1): Pt will transfer to BSC/ toilet with min A with extra time OT Short Term Goal 2 (Week 1): Pt will don LB clothing with AE prn (including footwear) with mod A OT Short Term Goal 3 (Week 1): Pt will perform sit to stands with min A in prep for clothing management  Skilled Therapeutic Interventions/Progress Updates:    Pt greeted w/c level at sink wearing his bledsoe brace, starting to wash up with SO present. He reported being premedicated for pain. Supervision/setup for UB self care including donning overhead shirt. Min A for standing balance and vcs for precaution adherence during sit<stand at sink where he completed perihygiene. Pt reporting TDWB vs NWB with L LE while standing. Discussed using lateral leans at next ADL session for improved precaution adherence. Min A to wash feet and don gripper socks. He was able to use the reacher to thread LEs into pants. Lateral leans for elevating pants 75% of the way with pt initiating pulling them up fully by standing. Instructed pt to only remove 1 UE support from sink to improve compliance with WB status. Extensive education was then completed regarding w/c parts mgt including adjusting ELR bilaterally. Pt in time able to retrieve his leg rests from the floor safely and apply them to his w/c without cuing. Next practiced toilet transfers. Very poor eccentric control when descending to standard toilet, so used the drop arm BSC over toilet. He was steadier with squat pivot vs stand pivot transfers (Mod A). Practiced dropping each arm of BSC so pt can lean to complete his own hygiene. Also reviewed 1 legged mini squat-stands so staff can assist with clothing mgt if pt has a tough time with  lateral leans for clothing. He then self propelled w/c ~175 ft down hallway, vcs for larger vs smaller pushes to conserve energy. Gave him w/c gloves to improve hand comfort which pt reported helped "a lot." Pt needed assistance to find his way back to the room. Left him with all needs within reach, sitting on chair alarm.   Therapy Documentation Precautions:  Precautions Precautions: Fall Precaution Comments: LLE NWB, RLE WBAT, R bledsoe brace knee unlocked to allow flexion Required Braces or Orthoses: Other Brace(R bledsoe brace knee unlocked to allow flexion) Knee Immobilizer - Right: On at all times Other Brace: R bledsoe brace knee unlocked to allow flexion Restrictions Weight Bearing Restrictions: Yes RLE Weight Bearing: Weight bearing as tolerated LLE Weight Bearing: Non weight bearing ADL: ADL Grooming: Minimal assistance Where Assessed-Grooming: Sitting at sink Upper Body Bathing: Setup Where Assessed-Upper Body Bathing: Sitting at sink Lower Body Bathing: Maximal assistance Where Assessed-Lower Body Bathing: Sitting at sink Upper Body Dressing: Moderate assistance Where Assessed-Upper Body Dressing: Sitting at sink Lower Body Dressing: Maximal assistance Where Assessed-Lower Body Dressing: Sitting at sink Toileting: Dependent Toilet Transfer: Moderate assistance Toilet Transfer Equipment: Energy manager: Not assessed Social research officer, government: Not assessed    :     Therapy/Group: Individual Therapy  Dillie Burandt A Dakota Stangl 05/02/2019, 12:42 PM
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
## 2019-05-02 NOTE — Progress Notes (Signed)
Physical Therapy Session Note  Patient Details  Name: Eric [name] MRN: [id number] Date of Birth: 11/05/1970  Today's Date: 05/02/2019 PT Individual Time: 9629-5284 PT Individual Time Calculation (min): 76 min   Short Term Goals: Week 1:  PT Short Term Goal 1 (Week 1): Pt will perform bed mobility CGA PT Short Term Goal 2 (Week 1): Pt will transfer sit<>stand with LRAD min A PT Short Term Goal 3 (Week 1): Pt will transfer bed<>chair CGA  Skilled Therapeutic Interventions/Progress Updates:     Patient in w/c with his wife in the room upon PT arrival. Patient alert and agreeable to PT session. Patient reported 6-7/10 B shoulder pain during session, reported being pre-medicated prior to session. PT provided repositioning, rest breaks, and distraction as pain interventions throughout session. R Bledsoe brace donned prior to session, PT readjusted brace at beginning of session due to improper alignment and educated patient on donning/doffing brace.   Focused session on gait training, w/c mobility, and UE stretching for increased ROM and pain management.  Therapeutic Activity: Bed Mobility: Patient performed sit to supine with supervision. Provided verbal cues for scooting up in the bed without use of L LE to maintain NWB. Transfers: Patient performed sit to/from stand x5 and stand pivot x1 with CGA-min A using a RW and // bars. Provided verbal cues for hand placement and reaching back to sit. He performed a squat pivot transfer w/c>bed with supervision for safety, provided cues for w/c set up for transfer.  Gait Training:  Patient ambulated 16 feet down and back in the // bars and 8 feet forward and backwards in the // bars with CGA. He then ambulated 26 feet, 36 feet, and 52 feet using the RW with CGA. Ambulated with hop-to gait pattern on R with decreased step length and good control at initial contact. Provided verbal cues and demonstration for shoulder depression with scapular retraction for  increased back support for UEs and use of UEs to control placement of R foot.  Wheelchair Mobility:  Patient propelled wheelchair 120 feet and 68 feet with supervision. Provided verbal cues for propulsion and turning technique and set up assist for donning/doffing ELRs.   Therapeutic Exercise: Patient performed the following exercises with verbal and tactile cues for proper technique. -B lateral shoulder abduction stretch using yellow physio ball performing lateral leans for increased ROM and reduced tightness with mobility x15 -B forward shoulder flexion with yellow physio ball on a table performing forward leans for increased ROM and reduced tightness with mobility x15 Noted increased shoulder flexion from grossly ~90 degrees to ~110 degrees on R and ~100 degree to ~120 degrees on L following exercises.  Patient in bed with Bledsoe brace donned with his wife at bedside at end of session with breaks locked, bed alarm set, and all needs within reach. Educated on energy conservation techniques and use of ice, elevation, and breaks for pain management throughout the day.    Therapy Documentation Precautions:  Precautions Precautions: Fall Precaution Comments: LLE NWB, RLE WBAT, R bledsoe brace knee unlocked to allow flexion Required Braces or Orthoses: Other Brace(R bledsoe brace knee unlocked to allow flexion) Knee Immobilizer - Right: On at all times Other Brace: R bledsoe brace knee unlocked to allow flexion Restrictions Weight Bearing Restrictions: Yes RLE Weight Bearing: Weight bearing as tolerated LLE Weight Bearing: Non weight bearing    Therapy/Group: Individual Therapy  Eric [name] PT, DPT  05/02/2019, 3:36 PM

## 2019-05-02 NOTE — Progress Notes (Signed)
Speech Language Pathology Daily Session Note  Patient Details  Name: Eric [name] MRN: [id number] Date of Birth: 1970/11/05  Today's Date: 05/02/2019 SLP Individual Time: 0962-8366 SLP Individual Time Calculation (min): 43 min  Short Term Goals: Week 1: SLP Short Term Goal 1 (Week 1): Pt will demonstrate functional problem solving for familiar mildly complex tasks with Min A verbal/visual cues. SLP Short Term Goal 2 (Week 1): Pt will complete functional math activities with Min A verbal/visual cues. SLP Short Term Goal 3 (Week 1): Pt will recall basic daily and/or new information with Min A cues for use of compensatory memory strategies/aids.  Skilled Therapeutic Interventions: Pt was seen for skilled ST targeting cognitive goals. SLP facilitated session by providing consistent Min A verbal cues for error awareness, fluctuating Supervision-Min A verbal cues for problem solving during functional semi-complex daily math problems (ALFA). Pt can be impulsive and requires cues to slow down; he is receptive to such verbal reminders and endorses his need to slow down in all aspects of his therapy. Accuracy during math calculations increased when he allowed himself more time. SLP further facilitated session with verbal review of pt's current medications. Mod-Max A required for recall or names/functions/dosgaes. Pt also used schedule as external aid with Supervision A to relay schedule for the day. Min A verbal question cues provided for recall of last ST session. Pt left laying in bed with alarm set and all needs within reach. Continue per current plan of care.      Pain Pain Assessment Pain Score: 0-No pain  Therapy/Group: Individual Therapy  Arbutus Leas 05/02/2019, 12:00 PM

## 2019-05-03 ENCOUNTER — Inpatient Hospital Stay (HOSPITAL_COMMUNITY): Payer: Managed Care, Other (non HMO)

## 2019-05-03 ENCOUNTER — Encounter (HOSPITAL_COMMUNITY): Payer: Managed Care, Other (non HMO) | Admitting: Psychology

## 2019-05-03 ENCOUNTER — Inpatient Hospital Stay (HOSPITAL_COMMUNITY): Payer: Managed Care, Other (non HMO) | Admitting: Occupational Therapy

## 2019-05-03 ENCOUNTER — Inpatient Hospital Stay (HOSPITAL_COMMUNITY): Payer: Managed Care, Other (non HMO) | Admitting: Speech Pathology

## 2019-05-03 DIAGNOSIS — R52 Pain, unspecified: Secondary | ICD-10-CM

## 2019-05-03 LAB — BASIC METABOLIC PANEL
Anion gap: 10 (ref 5–15)
BUN: 12 mg/dL (ref 6–20)
CO2: 25 mmol/L (ref 22–32)
Calcium: 9 mg/dL (ref 8.9–10.3)
Chloride: 103 mmol/L (ref 98–111)
Creatinine, Ser: 0.96 mg/dL (ref 0.61–1.24)
GFR calc Af Amer: >60 mL/min (ref >60)
GFR calc non Af Amer: >60 mL/min (ref >60)
Glucose, Bld: 115 mg/dL — ABNORMAL HIGH (ref 70–99)
Potassium: 3.5 mmol/L (ref 3.5–5.1)
Sodium: 138 mmol/L (ref 135–145)

## 2019-05-03 LAB — CBC
HCT: 32.2 % — ABNORMAL LOW (ref 39.0–52.0)
Hemoglobin: 10.5 g/dL — ABNORMAL LOW (ref 13.0–17.0)
MCH: 28.8 pg (ref 26.0–34.0)
MCHC: 32.6 g/dL (ref 30.0–36.0)
MCV: 88.5 fL (ref 80.0–100.0)
Platelets: 316 10*3/uL (ref 150–400)
RBC: 3.64 MIL/uL — ABNORMAL LOW (ref 4.22–5.81)
RDW: 13.2 % (ref 11.5–15.5)
WBC: 8 10*3/uL (ref 4.0–10.5)
nRBC: 0 % (ref 0.0–0.2)
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
## 2019-05-03 MED ORDER — GABAPENTIN 300 MG PO CAPS
300.0000 mg | ORAL_CAPSULE | Freq: Three times a day (TID) | ORAL | Status: DC
  Administered 2019-05-03 – 2019-05-08 (×15): 300 mg via ORAL
  Filled 2019-05-03 (×15): qty 1

## 2019-05-03 NOTE — Progress Notes (Signed)
Speech Language Pathology Daily Session Note  Patient Details  Name: Eric [name] MRN: [id number] Date of Birth: 12/14/1970  Today's Date: 05/03/2019 SLP Individual Time: 3235-5732 SLP Individual Time Calculation (min): 53 min  Short Term Goals: Week 1: SLP Short Term Goal 1 (Week 1): Pt will demonstrate functional problem solving for familiar mildly complex tasks with Min A verbal/visual cues. SLP Short Term Goal 2 (Week 1): Pt will complete functional math activities with Min A verbal/visual cues. SLP Short Term Goal 3 (Week 1): Pt will recall basic daily and/or new information with Min A cues for use of compensatory memory strategies/aids.  Skilled Therapeutic Interventions: Pt was seen for skilled ST targeting cognitive skills. SLP facilitated session with overall Supervision A verbal cues for problem solving during a complex medication management task - pt used list of current medications to organize QID pill box with 100% accuracy. Pt recalled 1/5 medications Mod I, otherwise requiring Min-Mod A verbal cues for recall of names/functions/dosages of remaining meds. SLP further facilitated session with Min A verbal and visual cues for problem solving, planning, recall of instructions and reminders to "slow down" during a novel semi-complex card game (Blink). Prior to end of session pt used daily schedule with Supervision A to anticipate remaining therapies for the day.      Pain Pain Assessment Pain Score: 0-No pain  Therapy/Group: Individual Therapy  Arbutus Leas 05/03/2019, 9:11 AM

## 2019-05-03 NOTE — Plan of Care (Signed)
  Problem: Consults Goal: RH GENERAL PATIENT EDUCATION Description: See Patient Education module for education specifics. Outcome: Progressing Goal: Skin Care Protocol Initiated - if Braden Score 18 or less Description: If consults are not indicated, leave blank or document N/A Outcome: Progressing   Problem: RH BOWEL ELIMINATION Goal: RH STG MANAGE BOWEL W/MEDICATION W/ASSISTANCE Description: STG Manage Bowel with Medication with mod I Assistance. Outcome: Progressing   Problem: RH SAFETY Goal: RH STG ADHERE TO SAFETY PRECAUTIONS W/ASSISTANCE/DEVICE Description: STG Adhere to Safety Precautions With mod I Assistance/Device. Outcome: Progressing   Problem: RH PAIN MANAGEMENT Goal: RH STG PAIN MANAGED AT OR BELOW PT'S PAIN GOAL Description: < 4 Outcome: Progressing
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
## 2019-05-03 NOTE — Progress Notes (Signed)
Physical Therapy Session Note  Patient Details  Name: Eric [name] MRN: [id number] Date of Birth: 09-19-1970  Today's Date: 05/03/2019 PT Individual Time: 0900-0959 PT Individual Time Calculation (min): 59 min   Short Term Goals: Week 1:  PT Short Term Goal 1 (Week 1): Pt will perform bed mobility CGA PT Short Term Goal 2 (Week 1): Pt will transfer sit<>stand with LRAD min A PT Short Term Goal 3 (Week 1): Pt will transfer bed<>chair CGA  Skilled Therapeutic Interventions/Progress Updates:   Received pt sitting EOB, pt agreeable to PT, and denied any pain during session. Session focused on functional mobility/transfers, ambulation, LE strength, bilateral UE ROM, balance/coordination, and improved tolerance to activity. Pt performed stand<>pivot transfer x3 trials throughout session with RW CGA. Pt performed WC mobility with bilateral UE's 150ft with supervision. Pt ambulated 11ft with RW CGA with verbal cues for slow pace and RLE foot clearance. Pt performed car transfer with RW min A to stand from low surface. Pt performed furniture transfer from couch and recliner min A with RW and verbal cues for hand placement and to lean forward. In standing with RW CGA, pt played horseshoes x3 trials with 1UE support. Pt performed seated bilateral shoulder flexion with RW 5x20 second hold. Pt performed bed mobility with supervision. In supine, pt performed the following exercises to improve R UE ROM: bilateral shoulder flexion with hockey stick x5 reps, R UE ER with hockey stick x5 reps. Pt reported increased pain in R deltoid with R shoulder ER exercise. Pt performed bilateral chest press with hockey stick 1x10, and bilateral SLR 1x10. Pt reported fatigue at end of session. Concluded session with pt sitting in WC, needs within reach, and chair pad alarm on.   Therapy Documentation Precautions:  Precautions Precautions: Fall Precaution Comments: LLE NWB, RLE WBAT, R bledsoe brace knee unlocked to allow  flexion Required Braces or Orthoses: Other Brace(R bledsoe brace knee unlocked to allow flexion) Knee Immobilizer - Right: On at all times Other Brace: R bledsoe brace knee unlocked to allow flexion Restrictions Weight Bearing Restrictions: Yes RLE Weight Bearing: Weight bearing as tolerated LLE Weight Bearing: Non weight bearing   Therapy/Group: Individual Therapy  Alfonse Alpers PT, DPT   05/03/2019, 7:47 AM
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
## 2019-05-03 NOTE — Progress Notes (Signed)
Occupational Therapy Session Note  Patient Details  Name: Eric [name] MRN: [id number] Date of Birth: 03-09-1971  Today's Date: 05/03/2019 OT Individual Time: 1478-2956 OT Individual Time Calculation (min): 58 min    Short Term Goals: Week 1:  OT Short Term Goal 1 (Week 1): Pt will transfer to BSC/ toilet with min A with extra time OT Short Term Goal 2 (Week 1): Pt will don LB clothing with AE prn (including footwear) with mod A OT Short Term Goal 3 (Week 1): Pt will perform sit to stands with min A in prep for clothing management  Skilled Therapeutic Interventions/Progress Updates:    Treatment session with focus on functional tranfers and dynamic standing balance.  Pt received upright in bed finishing lunch.  Pt completed bed mobility supervision to EOB and stand pivot transfer with RW Min assist.  Pt propelled w/c to ADL apt with supervision.  Educated on tub/shower transfers with pt completing with tub transfer bench with CGA and min cues for sequencing.  Pt ambulated tub to toilet with RW with CGA.  Discussed standard BSC vs drop arm BSC with pt reporting preference for drop arm BSC to allow for hygiene and clothing management with lateral leans (as per completed with previous OT session).  Engaged in sit > stand and dynamic standing balance in therapy gym with focus on NWB through LLE while standing and completing trunk rotation and reaching as needed for various functional tasks.  Pt returned to room via w/c and completed short distance ambulation with RW to recliner with CGA.  Pt left upright on chair alarm with all needs in reach.  Therapy Documentation Precautions:  Precautions Precautions: Fall Precaution Comments: LLE NWB, RLE WBAT, R bledsoe brace knee unlocked to allow flexion Required Braces or Orthoses: Other Brace(R bledsoe brace knee unlocked to allow flexion) Knee Immobilizer - Right: On at all times Other Brace: R bledsoe brace knee unlocked to allow  flexion Restrictions Weight Bearing Restrictions: Yes RLE Weight Bearing: Weight bearing as tolerated LLE Weight Bearing: Non weight bearing General:   Vital Signs: Therapy Vitals Temp: (!) 97.4 F (36.3 C) Temp Source: Oral Pulse Rate: (!) 105 Resp: 18 BP: 110/69 Patient Position (if appropriate): Sitting Oxygen Therapy SpO2: 98 % O2 Device: Room Air Pain:  Pt with no c/o pain   Therapy/Group: Individual Therapy  Simonne Come 05/03/2019, 3:26 PM

## 2019-05-03 NOTE — Progress Notes (Signed)
Physical Therapy Session Note  Patient Details  Name: Eric [name] MRN: [id number] Date of Birth: [birth year]  Today's Date: 05/03/2019 PT Individual Time: 6767-2094 PT Individual Time Calculation (min): 40 min   Short Term Goals: Week 1:  PT Short Term Goal 1 (Week 1): Pt will perform bed mobility CGA PT Short Term Goal 2 (Week 1): Pt will transfer sit<>stand with LRAD min A PT Short Term Goal 3 (Week 1): Pt will transfer bed<>chair CGA  Skilled Therapeutic Interventions/Progress Updates:   Pt seated in recliner.  He rated pain in face 4/10, premedicated.  Stand pivot to WC with CGA, using RW. Pt placed bil legrests on w/c with extra time, (from waist hight placement of legrests).  W/c propulsion using bil UEs independent on unit.    Gait training up/down ramp with RW, CGA, without difficulty.  Gait with RW on level tile x 50' with RW, CGA.  Seated Therapeutic exercise performed with LE to increase strength for functional mobility: R heel raises, L heel slides with washcloth under foot, L ankle pumps with straight knee, foot supported on footstool, x 15 each.  At end of session, pt seated in recliner with needs at hand and seat pad alarm set.       Therapy Documentation Precautions:  Precautions Precautions: Fall Precaution Comments: LLE NWB, RLE WBAT, R bledsoe brace knee unlocked to allow flexion Required Braces or Orthoses: Other Brace(R bledsoe brace knee unlocked to allow flexion) Knee Immobilizer - Right: On at all times Other Brace: R bledsoe brace knee unlocked to allow flexion Restrictions Weight Bearing Restrictions: Yes RLE Weight Bearing: Weight bearing as tolerated LLE Weight Bearing: Non weight bearing       Therapy/Group: Individual Therapy  Bellarose Burtt 05/03/2019, 4:03 PM

## 2019-05-04 ENCOUNTER — Inpatient Hospital Stay (HOSPITAL_COMMUNITY): Payer: Managed Care, Other (non HMO)

## 2019-05-04 ENCOUNTER — Inpatient Hospital Stay (HOSPITAL_COMMUNITY): Payer: Managed Care, Other (non HMO) | Admitting: Occupational Therapy

## 2019-05-04 ENCOUNTER — Encounter (HOSPITAL_COMMUNITY): Payer: Self-pay | Admitting: Dentistry

## 2019-05-04 ENCOUNTER — Inpatient Hospital Stay (HOSPITAL_COMMUNITY): Payer: Managed Care, Other (non HMO) | Admitting: Speech Pathology

## 2019-05-04 MED ORDER — METHOCARBAMOL 500 MG PO TABS: 1000.0000 mg | ORAL_TABLET | Freq: Four times a day (QID) | ORAL | Status: DC | PRN

## 2019-05-04 NOTE — Progress Notes (Signed)
Occupational Therapy Weekly Progress Note  Patient Details  Name: Maxi Rodas MRN: [id number] Date of Birth: [birth year]  Beginning of progress report period: April 28, 2019 End of progress report period: May 04, 2019  Today's Date: 05/04/2019 OT Individual Time: 7425-9563 OT Individual Time Calculation (min): 72 min    Patient has met 3 of 3 short term goals.  Pt is making steady progress towards goals.  Pt has demonstrated ability to complete sit <> stand with RW with CGA and is able to complete stand pivot and ambulatory transfers with CGA with RW.  Education has begun on use of AE for LB dressing, however pt is able to demonstrate increased reach and is able to thread pants and don shoe without use of AE.  Pt continues to c/o pain in face and Rt shoulder - impacting ability to reach during functional tasks.  Have begun focus on dynamic standing as well as when to utilize lateral leans for improved safety and adherence to RLE NWB during self-care tasks.     Patient continues to demonstrate the following deficits: muscle weakness and acute pain, decreased cardiorespiratoy endurance, new vision loss, decreased memory and delayed processing and decreased standing balance, decreased balance strategies and difficulty maintaining precautions and therefore will continue to benefit from skilled OT intervention to enhance overall performance with BADL and Reduce care partner burden.  Patient progressing toward long term goals..  Continue plan of care.  OT Short Term Goals Week 1:  OT Short Term Goal 1 (Week 1): Pt will transfer to BSC/ toilet with min A with extra time OT Short Term Goal 1 - Progress (Week 1): Met OT Short Term Goal 2 (Week 1): Pt will don LB clothing with AE prn (including footwear) with mod A OT Short Term Goal 2 - Progress (Week 1): Met OT Short Term Goal 3 (Week 1): Pt will perform sit to stands with min A in prep for clothing management OT Short Term Goal 3 - Progress  (Week 1): Met Week 2:  OT Short Term Goal 1 (Week 2): STG = LTGs due to remaining ELOS  Skilled Therapeutic Interventions/Progress Updates:    Treatment session with focus on LB dressing, functional transfers, and Rt shoulder mobility and pain management. Pt received upright in bed agreeable to therapy session.  Pt reports completing self-care retraining with wife yesterday evening.  Discussed technique for LB dressing with pt reporting only setup from wife and CGA when pulling pants over hips with lateral leans.  Pt reports no longer needing to use reacher for LB dressing.  Pt donned Rt shoe with setup and fastened shoe with increased time.  Sit > stand from EOB with RW and CGA, then ambulated to w/c 10' with RW and CGA.  Engaged in shoulder ROM to increase shoulder flexion and abduction as well as decrease pain with movements.  Pt able to achieve 90* shoulder flexion against gravity with c/o pain 5-6/10.  Initiated task in sidelying with focus on shoulder flexion, increasing angle of flexion in sidelying with support on powder board.  Able to complete full flexion up to ~150* with pt reporting pain 5/10 but "manageable".  Engaged in shoulder mobility in supine with use of hoop progressing shoulder flexion as well as internal/external rotation.  Educated on internal/external rotation, abduction/adduction, and shoulder flexion in supine - exercises he can complete in bed to increase ROM and decrease pain.  Pt able to complete shoulder flexion to 140* against gravity at end of session, with  pt reporting decrease in pain.  Returned to room and transferred back to bed as above with CGA with RW.  Pt left semi-reclined in bed with all needs in reach.  Therapy Documentation Precautions:  Precautions Precautions: Fall Precaution Comments: LLE NWB, RLE WBAT, R bledsoe brace knee unlocked to allow flexion Required Braces or Orthoses: Other Brace(R bledsoe brace knee unlocked to allow flexion) Knee Immobilizer -  Right: On at all times Other Brace: R bledsoe brace knee unlocked to allow flexion Restrictions Weight Bearing Restrictions: Yes RLE Weight Bearing: Weight bearing as tolerated LLE Weight Bearing: Non weight bearing Pain: Pt reports pain 4/10 in face.  Premedicated.  Therapy/Group: Individual Therapy  Simonne Come 05/04/2019, 12:32 PM

## 2019-05-04 NOTE — Progress Notes (Signed)
Speech Language Pathology Daily Session Note  Patient Details  Name: Matin Mattioli MRN: [id number] Date of Birth: 1971-05-19  Today's Date: 05/04/2019 SLP Individual Time: 4098-1191 SLP Individual Time Calculation (min): 41 min  Short Term Goals: Week 1: SLP Short Term Goal 1 (Week 1): Pt will demonstrate functional problem solving for familiar mildly complex tasks with Min A verbal/visual cues. SLP Short Term Goal 2 (Week 1): Pt will complete functional math activities with Min A verbal/visual cues. SLP Short Term Goal 3 (Week 1): Pt will recall basic daily and/or new information with Min A cues for use of compensatory memory strategies/aids.  Skilled Therapeutic Interventions: Pt was seen for skilled ST targeting cognitive goals. SLP facilitated session with overall Min A verbal cues for set up organization, problem solving, and awareness of 1 error during a complex deductive reasoning scheduling task (Mary's schedule). In functional conversation pt also recalled 1 of his current medications without cues, otherwise requiring min A verbal cues for recall of names and functions of remaining medications. Pt left laying in bed with alarm set and all needs within reach. Continue per current plan of care.      Pain Pain Assessment Pain Scale: 0-10 Pain Score: 0-No pain   Therapy/Group: Individual Therapy  Arbutus Leas 05/04/2019, 8:11 AM
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
## 2019-05-04 NOTE — Progress Notes (Signed)
Physical Therapy Session Note  Patient Details  Name: Eric [name] MRN: [id number] Date of Birth: 1970-09-12  Today's Date: 05/04/2019 PT Individual Time: 1300-1416 PT Individual Time Calculation (min): 76 min   Short Term Goals: Week 1:  PT Short Term Goal 1 (Week 1): Pt will perform bed mobility CGA PT Short Term Goal 2 (Week 1): Pt will transfer sit<>stand with LRAD min A PT Short Term Goal 3 (Week 1): Pt will transfer bed<>chair CGA  Skilled Therapeutic Interventions/Progress Updates:   Received pt supine in bed, pt agreeable to PT. Conversation had regarding moving up discharge date to Saturday 11/14; pt in agreement and enthusiastic about potential to leave earlier. Pt continues to report R shoulder pain with activity but did not state pain level during session. RN applied pain relieving cream to R shoulder at beginning of session. Pt performed bed mobility with supervision and HOB elevated x 2 trials throughout session. Pt able to maintain LLE NWB precautions throughout session with minimal verbal cueing. Pt transferred stand<>pivot with RW CGA x5 trials throughout session. Pt performed WC propulsion 229ft using bilateral UE's and supervision. Pt reported his WC was not the most comfortable; PT assisted with switching to 20x18 chair. Pt ambulated 1106ft with RW CGA. Pt required verbal cues for RLE foot clearance and to slow pace. Pt reported R shoulder pain increased after ambulation due to increased use of UE's. Standing with bilateral UE support on parallel bars pt performed LLE hip abduction, flexion, extension, and knee flexion 2x10 CGA with verbal cues for technique. While standing with 1 UE support on RW, pt threw beanbags at [facility] board x 2 trials CGA. Pt reported R shoulder discomfort with throwing but stated he wanted to work through the pain to strengthen his arm. Pt educated on importance of muscle strengthening in a pain free range. Pt verbalized understanding. Concluded session  with pt supine in bed, needs within reach, and bed alarm on.    Therapy Documentation Precautions:  Precautions Precautions: Fall Precaution Comments: LLE NWB, RLE WBAT, R bledsoe brace knee unlocked to allow flexion Required Braces or Orthoses: Other Brace(R bledsoe brace knee unlocked to allow flexion) Knee Immobilizer - Right: On at all times Other Brace: R bledsoe brace knee unlocked to allow flexion Restrictions Weight Bearing Restrictions: Yes RLE Weight Bearing: Weight bearing as tolerated LLE Weight Bearing: Non weight bearing  Therapy/Group: Individual Therapy  Alfonse Alpers PT, DPT   05/04/2019, 12:55 PM

## 2019-05-05 ENCOUNTER — Inpatient Hospital Stay (HOSPITAL_COMMUNITY): Payer: Managed Care, Other (non HMO)

## 2019-05-05 ENCOUNTER — Inpatient Hospital Stay (HOSPITAL_COMMUNITY): Payer: Managed Care, Other (non HMO) | Admitting: Speech Pathology

## 2019-05-05 MED ORDER — POLYETHYLENE GLYCOL 3350 17 G PO PACK
17.0000 g | PACK | Freq: Two times a day (BID) | ORAL | Status: DC
  Filled 2019-05-05 (×4): qty 1
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
## 2019-05-05 NOTE — Progress Notes (Signed)
Physical Therapy Weekly Progress Note  Patient Details  Name: Eric [name] MRN: [id number] Date of Birth: 03-30-1971  Beginning of progress report period: April 28, 2019 End of progress report period: May 05, 2019  Today's Date: 05/05/2019 PT Individual Time: 1400-1503 PT Individual Time Calculation (min): 63 min   Patient has met 3 of 3 short term goals. Pt continues to demonstrate improvements in UE/LE strength, standing dynamic balance and coordination, transfers, AD safety, ambulation, maintaining LLE NWB precautions, and improved tolerance to activity. Pt currently able to perform bed mobility with supervision, transfer stand<>pivot with RW CGA, ambulate approximately 170f with RW CGA, and perform WC mobility 151fwith supervision using bilateral UE's. However, pt continues to report visual deficits in R eye affecting dynamic standing balance, decreased L knee flexion ROM, R shoulder pain that increases with activity, and requires moderate verbal cues to maintain LLE NWB precautions.   Patient continues to demonstrate the following deficits muscle weakness and muscle joint tightness, decreased visual acuity and decreased standing balance, decreased postural control, decreased balance strategies and difficulty maintaining precautions and therefore will continue to benefit from skilled PT intervention to increase functional independence with mobility.  Patient progressing toward long term goals.. Continue plan of care.  PT Short Term Goals Week 1:  PT Short Term Goal 1 (Week 1): Pt will perform bed mobility CGA PT Short Term Goal 1 - Progress (Week 1): Met PT Short Term Goal 2 (Week 1): Pt will transfer sit<>stand with LRAD min A PT Short Term Goal 2 - Progress (Week 1): Met PT Short Term Goal 3 (Week 1): Pt will transfer bed<>chair CGA PT Short Term Goal 3 - Progress (Week 1): Met Week 2:  PT Short Term Goal 1 (Week 2): STG=LTG due to length of stay  Skilled Therapeutic  Interventions/Progress Updates:  Ambulation/gait training;Discharge planning;DME/adaptive equipment instruction;Functional mobility training;Pain management;Therapeutic Activities;UE/LE Strength taining/ROM;Balance/vestibular training;Community reintegration;Disease management/prevention;Functional electrical stimulation;Neuromuscular re-education;Patient/family education;Skin care/wound management;Stair training;Therapeutic Exercise;UE/LE Coordination activities;Wheelchair propulsion/positioning   Treatment Session Received pt supine in bed, pt agreeable to PT, and reports R shoulder pain but did not state number. Stated that pain was tolerable and requested pain medicine at end of session. Session focused on functional mobility/transfers, ambulation, LE/UE strength, balance/coordination, stair navigation, and improved activity tolerance. Pt performed bed mobility with supervision with HOB elevated. Pt performed squat<>pivot transfer without AD CGA x 2 trials throughout session and stand<>pivot transfer with RW CGA x 2 trials throughout session. Pt performed seated triceps push ups 2x8 with supervision. Pt performed sit<>stand on Airex with RW 2x5 CGA with verbal cues for technique and to maintain LLE NWB precautions. Pt ambulated 2524fith RW CGA. While standing pt performed ball toss on trampoline 1x10 with bilateral UE's, 1x10 throwing with RUE, and 1x10 throwing and catching with RUE all CGA. Pt navigated 4 steps with bilateral rails min A while maintaining LLE NWB precautions. While seated, pt hit volley ball hit with 3lb bar 4x10 form various directions and reaching overhead. Pt reported shoulder discomfort when reaching overhead. While seated, pt performed bilateral shoulder flexion with physioball on mat x10 forward and x10 R/L diagonal. In L sidelying, pt performed R shoulder abduction x 10 and R shoulder ER x 10 with no weight. Pt reported shoulder abduction was most uncomfortable. Concluded session  with pt supine in bed, needs within reach, bed alarm on and RN notified to administer pain medicine.   Therapy Documentation Precautions:  Precautions Precautions: Fall Precaution Comments: LLE NWB, RLE WBAT, R bledsoe  brace knee unlocked to allow flexion Required Braces or Orthoses: Other Brace(R bledsoe brace knee unlocked to allow flexion) Knee Immobilizer - Right: On at all times Other Brace: R bledsoe brace knee unlocked to allow flexion Restrictions Weight Bearing Restrictions: Yes RLE Weight Bearing: Weight bearing as tolerated LLE Weight Bearing: Non weight bearing  Therapy/Group: Individual Therapy Alfonse Alpers PT, DPT   05/05/2019, 9:02 AM

## 2019-05-05 NOTE — Progress Notes (Signed)
Occupational Therapy Session Note  Patient Details  Name: Eric [name] MRN: [id number] Date of Birth: 1970/09/18  Today's Date: 05/05/2019 OT Individual Time: 2951-8841 OT Individual Time Calculation (min): 68 min    Short Term Goals: Week 2:  OT Short Term Goal 1 (Week 2): STG = LTGs due to remaining ELOS  Skilled Therapeutic Interventions/Progress Updates:    Pt received supine in bed with no initial c/o pain and ready for tx. Pt transfers to EOB with (S) and completed lateral scoot from EOB <> w/c with (S). Transported to therapy gym completed functional reaching task in standing with RW for support. Functional reaching tasks focused on pt dynamic standing balance, activity tolerance and functional reaching for carryover to participation in ADLs and IADLs. Pt transported to therapy kitchen completed reaching task in standin using counter top for support when needed. Pt unloaded dishwasher and able to place items on multiple height shelves above head. Pt completed functional mobility to sofa in therapy apartment with (S); able to completed sit <> stand from sofa with CGA for balance when standing. Pt compelted functional mobility back to therapy gym completed [employer] for dynamic standing balance and reaching. Pt completed 2 min in stand with no rest break. Activity graded up with pt standing to R of [employer] and reach for lights on L side of [employer]. Pt completed 10 mins of B UE ergometer for increased endurance and B UE strangth for carryover to participation in functional tasks. Pt completed functional ambulation with RW and close (S) back to room. End of session pt left in bed, all needs met and bed alarm on.   Therapy Documentation Precautions:  Precautions Precautions: Fall Precaution Comments: LLE NWB, RLE WBAT, R bledsoe brace knee unlocked to allow flexion Required Braces or Orthoses: Other Brace(R bledsoe brace knee unlocked to allow flexion) Knee Immobilizer - Right: On at  all times Other Brace: R bledsoe brace knee unlocked to allow flexion Restrictions Weight Bearing Restrictions: Yes RLE Weight Bearing: Weight bearing as tolerated LLE Weight Bearing: Non weight bearing      Therapy/Group: Individual Therapy  [name] 05/05/2019, 10:32 AM

## 2019-05-05 NOTE — Progress Notes (Signed)
Speech Language Pathology Daily Session Note  Patient Details  Name: Eric [name] MRN: [id number] Date of Birth: 06/10/1971  Today's Date: 05/05/2019 SLP Individual Time: 7939-0300 SLP Individual Time Calculation (min): 53 min  Short Term Goals: Week 1: SLP Short Term Goal 1 (Week 1): Pt will demonstrate functional problem solving for familiar mildly complex tasks with Min A verbal/visual cues. SLP Short Term Goal 2 (Week 1): Pt will complete functional math activities with Min A verbal/visual cues. SLP Short Term Goal 3 (Week 1): Pt will recall basic daily and/or new information with Min A cues for use of compensatory memory strategies/aids.  Skilled Therapeutic Interventions: Pt was seen for skilled ST targeting cognitive goals. SLP facilitated session with Min A verbal cues for error awareness and attention to detail during a semi-complex monthly scheduling task, only Supervision A verbal/visual cues required for organization. In functional conversation, pt also demonstrated improved recall of medication names and functions, carryover of association "relaxin with robaxin" to remember name and function of that medication with only Supervision A question cues. SLP further facilitated session with familiar semi-complex card game (blink), during which pt was Mod I for recall and implementation of rules and problem solving. Pt used schedule to anticipate upcoming therapy appts with Supervision A verbal prompt. Pt left laying in bed with alarm set and all needs within reach. Continue per current plan of care.      Pain Pain Assessment Pain Scale: 0-10 Pain Score: 0-No pain  Therapy/Group: Individual Therapy  Arbutus Leas 05/05/2019, 9:27 AM

## 2019-05-05 NOTE — Progress Notes (Signed)
Social Work Patient ID: Eric [name], male   DOB: 07/23/1970, 48 y.o.   MRN: [id number]  Met with pt to discuss team conference goals supervision-min assist level and target discharge 11/14. He is pleased with his progress and is managing his pain. His girlfriend is taking a break today and will be here Friday for education for discharge Sat. Will order DME and set up follow up.
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
## 2019-05-06 ENCOUNTER — Inpatient Hospital Stay (HOSPITAL_COMMUNITY): Payer: Managed Care, Other (non HMO) | Admitting: Occupational Therapy

## 2019-05-06 ENCOUNTER — Inpatient Hospital Stay (HOSPITAL_COMMUNITY): Payer: Managed Care, Other (non HMO) | Admitting: Physical Therapy

## 2019-05-06 ENCOUNTER — Inpatient Hospital Stay (HOSPITAL_COMMUNITY): Payer: Managed Care, Other (non HMO)

## 2019-05-06 ENCOUNTER — Inpatient Hospital Stay (HOSPITAL_COMMUNITY): Payer: Managed Care, Other (non HMO) | Admitting: Speech Pathology

## 2019-05-06 LAB — CBC WITH DIFFERENTIAL/PLATELET
Abs Immature Granulocytes: 0.03 10*3/uL (ref 0.00–0.07)
Basophils Absolute: 0 10*3/uL (ref 0.0–0.1)
Basophils Relative: 1 %
Eosinophils Absolute: 0.2 10*3/uL (ref 0.0–0.5)
Eosinophils Relative: 4 %
HCT: 33.3 % — ABNORMAL LOW (ref 39.0–52.0)
Hemoglobin: 10.7 g/dL — ABNORMAL LOW (ref 13.0–17.0)
Immature Granulocytes: 1 %
Lymphocytes Relative: 30 %
Lymphs Abs: 1.4 10*3/uL (ref 0.7–4.0)
MCH: 28.8 pg (ref 26.0–34.0)
MCHC: 32.1 g/dL (ref 30.0–36.0)
MCV: 89.5 fL (ref 80.0–100.0)
Monocytes Absolute: 0.5 10*3/uL (ref 0.1–1.0)
Monocytes Relative: 10 %
Neutro Abs: 2.5 10*3/uL (ref 1.7–7.7)
Neutrophils Relative %: 54 %
Platelets: 310 10*3/uL (ref 150–400)
RBC: 3.72 MIL/uL — ABNORMAL LOW (ref 4.22–5.81)
RDW: 13.3 % (ref 11.5–15.5)
WBC: 4.5 10*3/uL (ref 4.0–10.5)
nRBC: 0 % (ref 0.0–0.2)

## 2019-05-06 MED ORDER — SENNOSIDES-DOCUSATE SODIUM 8.6-50 MG PO TABS
2.0000 | ORAL_TABLET | Freq: Two times a day (BID) | ORAL | Status: DC
  Filled 2019-05-06 (×2): qty 2

## 2019-05-06 NOTE — Progress Notes (Signed)
Occupational Therapy Session Note  Patient Details  Name: Cregg Jutte MRN: [id number] Date of Birth: 04/07/1971  Today's Date: 05/06/2019 OT Individual Time: 7829-5621 OT Individual Time Calculation (min): 71 min    Short Term Goals: Week 2:  OT Short Term Goal 1 (Week 2): STG = LTGs due to remaining ELOS  Skilled Therapeutic Interventions/Progress Updates:    Pt was able to complete transfers to and from the bed and mat during session with supervision.  He completed both stand pivot transfers with use of the RW and maintaining weightbearing restrictions on the LLE, as well as squat pivots.  He was able to roll himself down to the therapy gym with supervision and transferred to the therapy mat.  He was able to transition to supine with supervision.  Therapist worked on [employer] and LUE shoulder glenohumeral mobilizations and stretching to the internal rotators.  He transitioned to sitting where he worked on scapular strengthening and external rotators.  He was able to complete 3 sets of 10 reps shoulder external rotation bilaterally with min instructional cueing using the lightest resistance therapy band.  Had him focus on scapular retraction while completing each repetition.  He was able to complete 2 sets of 10 reps for shoulder extension with elbows flexed and slight external rotation at the end range.  Transitioned to supine to work on 1 set each of external rotation again, however UE was positioned in shoulder abduction around 70-80 degrees.  Pt reported his shoulders feeling better after completion of exercises.  Pt returned to the room via wheelchair at end of session with transfer back to the bed.  Call button and phone in reach and bed alarm in place.    Therapy Documentation Precautions:  Precautions Precautions: Fall Precaution Comments: LLE NWB, RLE WBAT, R bledsoe brace knee unlocked to allow flexion Required Braces or Orthoses: Other Brace Knee Immobilizer - Right: On at all  times Other Brace: R bledsoe brace knee unlocked to allow flexion Restrictions Weight Bearing Restrictions: Yes RLE Weight Bearing: Weight bearing as tolerated LLE Weight Bearing: Non weight bearing   Pain: Pain Assessment Pain Scale: 0-10 Pain Score: 0-No pain ADL: See Care Tool Section for some details of mobility and selfcare  Therapy/Group: Individual Therapy  Holt Woolbright OTR/L 05/06/2019, 12:24 PM

## 2019-05-06 NOTE — Progress Notes (Signed)
Speech Language Pathology Daily Session Note  Patient Details  Name: Eric [name] MRN: [id number] Date of Birth: 03-12-1971  Today's Date: 05/06/2019 SLP Individual Time: 0737-1062 SLP Individual Time Calculation (min): 55 min  Short Term Goals: Week 1: SLP Short Term Goal 1 (Week 1): Pt will demonstrate functional problem solving for familiar mildly complex tasks with Min A verbal/visual cues. SLP Short Term Goal 2 (Week 1): Pt will complete functional math activities with Min A verbal/visual cues. SLP Short Term Goal 3 (Week 1): Pt will recall basic daily and/or new information with Min A cues for use of compensatory memory strategies/aids.  Skilled Therapeutic Interventions: Pt was seen for skilled ST targeting cognitive skills. SLP facilitated session with Mod A faded to Min A verbal cues for problem solving, Min A for error awareness and organization during a complex deductive reasoning puzzle (furniture delivery). Pt did required increased cueing for problem solving today in comparison to previous session, suspect due to increased complexity of task. SLP further facilitated session with Supervision A verbal cues for use of visualization compensatory memory strategy during a novel memory card task (n-2).   Of note, in functional conversation pt reported frusration regarding inability to ambulate throughout room and complete some ADLs at night without staff assistance - he self-reported that he has gotten up to use the restroom by himself with his wife in the room before. SLP discussed rationale/safety risk associated with pt's attempts to complete ADLs without assistance, however also assured him I would speak with team to discuss potential for pt to be allowed more independence with ambulation in room until d/c - will consult with team today. Pt was left laying in bed with alarm set and all needs within reach. Continue per current plan of care.      Pain Pain Assessment Pain Score: 0-No  pain  Therapy/Group: Individual Therapy  Arbutus Leas 05/06/2019, 9:38 AM
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
## 2019-05-07 ENCOUNTER — Inpatient Hospital Stay (HOSPITAL_COMMUNITY): Payer: Managed Care, Other (non HMO) | Admitting: Occupational Therapy

## 2019-05-07 ENCOUNTER — Inpatient Hospital Stay (HOSPITAL_COMMUNITY): Payer: Managed Care, Other (non HMO) | Admitting: Speech Pathology

## 2019-05-07 ENCOUNTER — Inpatient Hospital Stay (HOSPITAL_COMMUNITY): Payer: Managed Care, Other (non HMO)

## 2019-05-07 LAB — BASIC METABOLIC PANEL
Anion gap: 10 (ref 5–15)
BUN: 10 mg/dL (ref 6–20)
CO2: 27 mmol/L (ref 22–32)
Calcium: 9.2 mg/dL (ref 8.9–10.3)
Chloride: 103 mmol/L (ref 98–111)
Creatinine, Ser: 0.91 mg/dL (ref 0.61–1.24)
GFR calc Af Amer: >60 mL/min (ref >60)
GFR calc non Af Amer: >60 mL/min (ref >60)
Glucose, Bld: 97 mg/dL (ref 70–99)
Potassium: 3.9 mmol/L (ref 3.5–5.1)
Sodium: 140 mmol/L (ref 135–145)

## 2019-05-07 MED ORDER — DICLOFENAC SODIUM 1 % TD GEL: 2.0000 g | Freq: Four times a day (QID) | TRANSDERMAL | 0 refills | Status: DC

## 2019-05-07 MED ORDER — ACETAMINOPHEN 325 MG PO TABS: 325.0000 mg | ORAL_TABLET | ORAL | Status: DC | PRN

## 2019-05-07 MED ORDER — ASPIRIN EC 325 MG PO TBEC: 325.0000 mg | DELAYED_RELEASE_TABLET | Freq: Every day | ORAL | 0 refills | Status: DC

## 2019-05-07 MED ORDER — OXYCODONE HCL 10 MG PO TABS: 5.0000 mg | ORAL_TABLET | Freq: Four times a day (QID) | ORAL | 0 refills | Status: DC | PRN

## 2019-05-07 MED ORDER — NAPHAZOLINE-GLYCERIN 0.012-0.2 % OP SOLN: 2.0000 [drp] | Freq: Three times a day (TID) | OPHTHALMIC | 0 refills | Status: DC

## 2019-05-07 MED ORDER — POLYETHYLENE GLYCOL 3350 17 G PO PACK: 17.0000 g | PACK | Freq: Every day | ORAL | 0 refills | Status: DC | PRN

## 2019-05-07 MED ORDER — GABAPENTIN 300 MG PO CAPS: 300.0000 mg | ORAL_CAPSULE | Freq: Three times a day (TID) | ORAL | 1 refills | Status: DC

## 2019-05-07 MED ORDER — METHOCARBAMOL 500 MG PO TABS: 500.0000 mg | ORAL_TABLET | Freq: Four times a day (QID) | ORAL | 0 refills | Status: DC | PRN

## 2019-05-07 MED ORDER — TRAZODONE HCL 50 MG PO TABS: 50.0000 mg | ORAL_TABLET | Freq: Every day | ORAL | 0 refills | Status: DC

## 2019-05-07 NOTE — Progress Notes (Signed)
Speech Language Pathology Discharge Summary  Patient Details  Name: Eric [name] MRN: [id number] Date of Birth: March 09, 1971  Today's Date: 05/07/2019 SLP Individual Time: 0730-0825 SLP Individual Time Calculation (min): 55 min   Skilled Therapeutic Interventions:  Pt was seen for skilled ST targeting cognitive skills. SLP facilitated session with a novel complex checkbook management task. Initial Supervision A verbal cues for organization and problem solving were quickly faded to Mod I (les than a quarter of the way into activity). Pt demonstrated excellent carryover of strategies and skills targeted throughout admission, and able to verbally identify and demonstrate importance of "slowing down" to improve accuracy during complex cognitive tasks today. In functional conversation he also recalled functions and dosages of all scheduled medications and 1 PRN medication. Pt demonstrated good anticipatory awareness in conversation regarding discharge and planning his daily routine to have appropriate levels of assistance from his significant other and care for his son as needed throughout day. Pt left laying in bed with alarm set and all needs within reach.     Patient has met 4 of 4 long term goals.  Patient to discharge at overall Modified Independent;Supervision level.  Reasons goals not met: n/a   Clinical Impression/Discharge Summary:   Pt made quick functional gains and met 4 out of 4 long term goals this admission. Pt is currently Mod I for semi-compex problem solving, anticipatory and safety awareness, Supervision assist for short term recall and would benefit from 24/7 supevision. Pt has demonstrated improved use of compensatory memory strategies to recall daily and new information, basic and semi-complex problem solving, and anticipatory awareness. No follow up ST is indicated, as pt's cognitive functioning is believed to be at baseline and functional for the cognitive demands placed on pt in  daily living. From a cognitive standpoint pt is safe to complete some complex ADLs such as medication and finance management as well as simple cooking with only intermittent supervision (Mod I). He has good awareness of when to ask for assistance with ADLs given level of physical assistance required at this time. Pt and family education is complete at this time.   Care Partner:  Caregiver Able to Provide Assistance: Yes  Type of Caregiver Assistance: Cognitive  Recommendation:  24 hour supervision/assistance  Rationale for SLP Follow Up: (no follow up ST indicated)   Equipment: none   Reasons for discharge: Discharged from hospital   Patient/Family Agrees with Progress Made and Goals Achieved: Yes    Arbutus Leas 05/07/2019, 8:24 AM
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
## 2019-05-07 NOTE — Discharge Instructions (Signed)
Inpatient Rehab Discharge Instructions  Eric [name] Discharge date and time:  05/08/19  Activities/Precautions/ Functional Status: Activity: no lifting, driving, or strenuous exercise till cleared by MD Diet: regular diet Wound Care: keep wound clean and dry--wash with soap and water. Keep clean and dry. Contact Dr. Sharol Given if you develop any problems with your incision/wound--redness, swelling, increase in pain, drainage or if you develop fever or chills.    Functional status:  ___ No restrictions     ___ Walk up steps independently _X__ 24/7 supervision/assistance   ___ Walk up steps with assistance ___ Intermittent supervision/assistance  ___ Bathe/dress independently ___ Walk with walker     ___ Bathe/dress with assistance ___ Walk Independently    ___ Shower independently ___ Walk with assistance    ___ Shower with assistance _X__ No alcohol     ___ Return to work/school ________   Special Instructions: 1. No weight on left leg. 2. Have to wear brace on right knee before getting out of bed.    COMMUNITY REFERRALS UPON DISCHARGE:    Outpatient: PT & OT  [place] XBLTJ:[phone] Date of Last Service:05/08/2019                            [place] [address]  Appointment Date/Time:  November 18 Wednesday 10:15-11:30 am  Medical Equipment/Items Ordered:WHEELCHAIR, Vassie Moselle, [place]  Agency/Supplier:[facility]   [phone]     My questions have been answered and I understand these instructions. I will adhere to these goals and the provided educational materials after my discharge from the hospital.  Patient/Caregiver Signature _______________________________ Date __________  Clinician Signature _______________________________________ Date __________  Please bring this form and your medication list with you to all your follow-up doctor's appointments.

## 2019-05-07 NOTE — Progress Notes (Signed)
Speech Language Pathology Daily Session Note  Patient Details  Name: Eric [name] MRN: [id number] Date of Birth: 1970-07-28  Today's Date: 05/07/2019 SLP Individual Time: 1300-1330 SLP Individual Time Calculation (min): 30 min  Short Term Goals: Week 1: SLP Short Term Goal 1 (Week 1): Pt will demonstrate functional problem solving for familiar mildly complex tasks with Min A verbal/visual cues. SLP Short Term Goal 2 (Week 1): Pt will complete functional math activities with Min A verbal/visual cues. SLP Short Term Goal 3 (Week 1): Pt will recall basic daily and/or new information with Min A cues for use of compensatory memory strategies/aids.  Skilled Therapeutic Interventions: Pt was seen for skilled ST targeting education with pt and his significant other as well as cognitive skills. SLP facilitated session with functional conversation regarding impending discharge, review of goals and quick progress in ST since admission. SLP emphasized impact of short term memory and organization on ADLs and importance of "slow down" and "double check" strategies during complex cognitive tasks (medication, finance management, cooking, etc.). Verbal review and handout for compensatory memory strategies also provided. Pt's significant other confirmed she can provide 24/7 supervision/assist. SLP further facilitated session with re-administration of MOCA (version 7.1) to assess pt's progress since initial evaluation. Due to time constraint MOCA was not administered in it's entirety, however pt demonstrated improvements in problem solving, planning, organization, and attention subtest. Category cues were required for short term recall of 4/5 words. Pt left laying in bed with alarm set and significant other still present. Continue per current plan of care.       Pain Pain Assessment Pain Score: 0-No pain  Therapy/Group: Individual Therapy  Arbutus Leas 05/07/2019, 3:04 PM

## 2019-05-07 NOTE — Progress Notes (Signed)
Physical Therapy Discharge Summary  Patient Details  Name: Eric [name] MRN: [id number] Date of Birth: [birth year]  Today's Date: 05/07/2019 PT Individual Time: 0900-1014 PT Individual Time Calculation (min): 74 min    Patient has met 10 of 10 long term goals due to improved activity tolerance, improved balance, improved postural control, increased strength, increased range of motion, decreased pain and improved coordination. Patient to discharge at a wheelchair level Supervision.Patient's care partner is independent to provide the necessary physical assistance at discharge. Pt verbalized and demonstrated confidence with tasks necessary to DC home safely.   All goals met  Recommendation:  Patient will benefit from ongoing skilled PT services in outpatient setting to continue to advance safe functional mobility, address ongoing impairments in transfers, ambulation, LE/UE strength, balance/coordination, improved tolerance to activity, and to minimize fall risk.  Equipment: WC, elevating legrests, RW  Reasons for discharge: treatment goals met  Patient/family agrees with progress made and goals achieved: Yes  Today's Interventions Received pt supine in bed, pt agreeable to therapy, and stated pain 2/10 in R shoulder. Session focused on functional mobility/transfers, ambulation, stair navigation, LE strength, balance/coordination, and improved tolerance to activity. Pt performed bed mobility independently with HOB elevated. Pt performed stand<>pivot transfer with RW supervision x 5 trials throughout session. Pt performed WC mobility 143f with bilateral UE's supervision. Pt performed car transfer with RW supervision. Pt ambulated 126fon uneven surface (ramp) with RW supervision. Pt required verbal cues to slow pace. Pt picked up small cone from ground with RW CGA. Pt performed bed mobility independently in rehab apartment. Pt performed furniture transfers on couch and recliner with RW  supervision. Pt ambualted 7646fith RW supervision. Pt reported increase in R shoulder pain after ambulation, requiring 2 minute seated rest break. Pt navigated 4 steps with 2 rails CGA while maintaining LLE NWB status. Pt ascended and descended with a step to pattern. In supine pt performed bridges 1x12 and 1x10 reps with verbal cues for technique and hip extension and bilateral SLR 2x10. Pt reported increased difficulty and discomfort on LLE. Pt performed supine bilateral shoulder flexion with 1lb bar 2x10. Concluded session with pt supine in bed, needs within reach, and bed alarm on.   PT Discharge Precautions/Restrictions Precautions Precautions: Fall Precaution Comments: LLE NWB, RLE WBAT, R bledsoe brace knee unlocked to allow flexion Required Braces or Orthoses: Other Brace Knee Immobilizer - Right: On at all times Other Brace: R bledsoe brace knee unlocked to allow flexion Restrictions Weight Bearing Restrictions: Yes RLE Weight Bearing: Weight bearing as tolerated LLE Weight Bearing: Non weight bearing Cognition Overall Cognitive Status: Within Functional Limits for tasks assessed Arousal/Alertness: Awake/alert Orientation Level: Oriented X4 Sustained Attention: Appears intact Memory: Impaired(however, pt is using compensatory memory strategies for functional recall) Memory Impairment: Decreased short term memory Decreased Short Term Memory: Verbal basic;Functional complex Awareness: Appears intact Problem Solving: Appears intact Executive Function: Organizing Organizing: Impaired Organizing Impairment: Verbal complex;Functional complex Safety/Judgment: Appears intact Sensation Sensation Light Touch: Appears Intact Proprioception: Appears Intact Additional Comments: Grossly intact bilaterally Coordination Gross Motor Movements are Fluid and Coordinated: No Fine Motor Movements are Fluid and Coordinated: No Coordination and Movement Description: Grossly uncoordinated due  to LLE NWB status, R shoulder pain, and LE weakness Finger Nose Finger Test: WNL Motor  Motor Motor: Abnormal postural alignment and control Motor - Skilled Clinical Observations: Grossly uncoordinated due to LE weakness, R shoulder pain, and LLE NWB status  Mobility Bed Mobility Bed Mobility: Rolling Right;Rolling Left;Sit to Supine;Supine to Sit  Rolling Right: Independent Rolling Left: Independent Supine to Sit: Independent Sit to Supine: Independent Transfers Transfers: Sit to Stand;Stand Pivot Transfers;Squat Pivot Transfers;Stand to Sit Sit to Stand: Supervision/Verbal cueing Stand to Sit: Supervision/Verbal cueing Stand Pivot Transfers: Supervision/Verbal cueing(RW) Stand Pivot Transfer Details: Verbal cues for precautions/safety Stand Pivot Transfer Details (indicate cue type and reason): Verb cues for LLE NWB precautions Squat Pivot Transfers: Supervision/Verbal cueing Transfer (Assistive device): Rolling walker Locomotion  Gait Ambulation: Yes Gait Assistance: Supervision/Verbal cueing(RW) Gait Distance (Feet): 76 Feet Assistive device: Rolling walker Gait Assistance Details: Verbal cues for precautions/safety Gait Assistance Details: Verbal cues for slow pace in order to maintain LLE NWB precautions Gait Gait: Yes Gait Pattern: Impaired Gait Pattern: Decreased step length - right;Decreased stance time - left;Decreased stride length;Decreased weight shift to left;Poor foot clearance - right;Decreased trunk rotation Gait velocity: decreased Stairs / Additional Locomotion Stairs: Yes Stairs Assistance: Contact Guard/Touching assist Stair Management Technique: Two rails Number of Stairs: 4 Height of Stairs: 6 Ramp: Supervision/Verbal cueing(RW) Product manager Mobility: Yes Wheelchair Assistance: Chartered loss adjuster: Both upper extremities Wheelchair Parts Management: Supervision/cueing Distance: 176f  Trunk/Postural  Assessment  Cervical Assessment Cervical Assessment: Within FPsychologist, forensicAssessment Thoracic Assessment: Within Functional Limits Lumbar Assessment Lumbar Assessment: Within Functional Limits Postural Control Postural Control: Deficits on evaluation  Balance Balance Balance Assessed: Yes Static Sitting Balance Static Sitting - Balance Support: No upper extremity supported Static Sitting - Level of Assistance: 7: Independent Dynamic Sitting Balance Dynamic Sitting - Balance Support: No upper extremity supported Dynamic Sitting - Level of Assistance: 7: Independent Static Standing Balance Static Standing - Balance Support: Bilateral upper extremity supported Static Standing - Level of Assistance: 5: Stand by assistance(supervision with RW) Dynamic Standing Balance Dynamic Standing - Balance Support: Bilateral upper extremity supported Dynamic Standing - Level of Assistance: 5: Stand by assistance(supervision with RW) Extremity Assessment  RLE Assessment RLE Assessment: Exceptions to [facility] InGeneral Strength Comments: Grossly 4/5 LLE Assessment LLE Assessment: Exceptions to [facility] CountyGeneral Strength Comments: Grossly 4-/5 except knee flexion 3+/5  AAlfonse AlpersPT, DPT  05/07/2019, 7:35 AM

## 2019-05-07 NOTE — Progress Notes (Signed)
Social Work Discharge Note   The overall goal for the admission was met for: DC SAT 11/14  Discharge location: Yes-HOME WITH GIRLFRIEND WHO CAN PROVIDE 24 HR SUPERVISION  Length of Stay: Yes-11 DAYS  Discharge activity level: Yes-SUPERVISION-CGA LEVEL  Home/community participation: Yes  Services provided included: MD, RD, PT, OT, SLP, RN, CM, Pharmacy, Neuropsych and SW  Financial Services: Private Insurance: [employer]  Follow-up services arranged: Outpatient: [facility]-OUTPATIENT PT & OT 11/18 10;30-11:30, DME: [facility]-WHEELCHAIR, ROLLING WALKER, DROP-ARM BEDSIDE COMMODE AND TUB BENCH and Patient/Family has no preference for HH/DME agencies Only Wed 11/18 with PT at 10:30-11:30 will add OT  Comments (or additional information):GIRLFRIEND WAS HERE DAILY AND WAS TRAINED IN HIS CARE. BOTH FEEL COMFORTABLE WITH DC AND READY TO GO HOME  Patient/Family verbalized understanding of follow-up arrangements: Yes  Individual responsible for coordination of the follow-up plan: SELF & [name]-GIRLFIREIND  Confirmed correct DME delivered: Elease Hashimoto 05/07/2019    Elease Hashimoto

## 2019-05-07 NOTE — Progress Notes (Signed)
Received order to remove staples and sutures from patient's left leg and nose. Patient tolerated procedure well and with no adverse reaction. Patient reported no discomfort or pain during removal process.

## 2019-05-07 NOTE — Progress Notes (Signed)
Occupational Therapy Discharge Summary  Patient Details  Name: Eric [name] MRN: [id number] Date of Birth: [birth year]  Today's Date: 05/07/2019 OT Individual Time: 1419-1530 OT Individual Time Calculation (min): 71 min   Session Note:  Pt's spouse present for education in preparation for discharge.  He was able to return demonstrate safe completion of simulated tub/shower transfers with supervision and his spouse assisting.  Discussed technique for getting in and out of the tub as well as the need for a hand held shower and removal of all throw rugs in the area.  They voiced understanding and agreement.  Therapist provided walker and reacher bags for pt to use at home and had him completed functional mobility with use of the walker to retrieve items from the floor in the gym.  He was able to use the reacher efficiently with supervision to gather them up.  Finished session with work on bilateral shoulder and arm strengthening with completion of level 1 therapy band for shoulder external rotation, elbow flexion, and shoulder row.  Focus was on maintaining scapular adduction during exercise.   Also had pt transfer to supine for light RUE shoulder flexion with emphasis on not activating the traps with completion of movement.  Handout given for referencing exercises with pt returning to the wheelchair with supervision and back to the room.  Pt's spouse checked off to assist pt with transfers to the bathroom.   Patient has met 10 of 10 long term goals due to improved activity tolerance, ability to compensate for deficits and functional use of  RIGHT upper extremity.  Patient to discharge at overall Supervision level.  Patient's care partner is independent to provide the necessary physical assistance at discharge.    Reasons goals not met: NA  Recommendation:  Patient will benefit from ongoing skilled OT services in outpatient setting to continue to advance functional skills in the area of BADL, iADL and  right shoulder strengthening.  Feel pt will benefit from continued outpatient OT to increase RUE shoulder strength and overall ADL function.    Equipment: tub bench and 3:1  Reasons for discharge: treatment goals met and discharge from hospital  Patient/family agrees with progress made and goals achieved: Yes  OT Discharge Precautions/Restrictions  Precautions Precautions: Fall Precaution Comments: LLE NWB, RLE WBAT, R bledsoe brace knee unlocked to allow flexion Knee Immobilizer - Right: On at all times Other Brace: R bledsoe brace knee unlocked to allow flexion Restrictions Weight Bearing Restrictions: Yes RLE Weight Bearing: Weight bearing as tolerated LLE Weight Bearing: Non weight bearing   Pain Pain Assessment Pain Scale: Faces Pain Score: 0-No pain ADL ADL Eating: Independent Where Assessed-Eating: Wheelchair Grooming: Independent Where Assessed-Grooming: Wheelchair Upper Body Bathing: Setup Where Assessed-Upper Body Bathing: Wheelchair Lower Body Bathing: Supervision/safety Where Assessed-Lower Body Bathing: Wheelchair Upper Body Dressing: Independent Where Assessed-Upper Body Dressing: Wheelchair Lower Body Dressing: Supervision/safety Where Assessed-Lower Body Dressing: Wheelchair Toileting: Supervision/safety Where Assessed-Toileting: Bedside Commode Toilet Transfer: Distant supervision Toilet Transfer Method: Counselling psychologist: Bedside commode Tub/Shower Transfer: Close supervison Clinical cytogeneticist Method: Optometrist: Facilities manager: Not assessed Vision Baseline Vision/History: No visual deficits Patient Visual Report: Blurring of vision Eye Alignment: Within Functional Limits Additional Comments: Noted right eye with still some redness.  Pt reports resolution of diplopia Perception  Perception: Within Functional Limits Praxis Praxis: Intact Cognition Overall Cognitive Status:  Within Functional Limits for tasks assessed Arousal/Alertness: Awake/alert Orientation Level: Oriented X4 Attention: Sustained;Selective Sustained Attention: Appears intact Selective Attention: Appears  intact Memory: Appears intact Safety/Judgment: Appears intact Sensation Sensation Light Touch: Appears Intact Proprioception: Appears Intact Coordination Gross Motor Movements are Fluid and Coordinated: No Fine Motor Movements are Fluid and Coordinated: Yes Coordination and Movement Description: Pt still with increased right shoulder weakness which limits UE coordination above chest level. Motor  Motor Motor: Within Functional Limits Mobility  Bed Mobility Bed Mobility: Sit to Supine;Supine to Sit Supine to Sit: Independent Sit to Supine: Independent Transfers Sit to Stand: Supervision/Verbal cueing Stand to Sit: Supervision/Verbal cueing  Trunk/Postural Assessment  Cervical Assessment Cervical Assessment: Within Functional Limits Thoracic Assessment Thoracic Assessment: Within Functional Limits Lumbar Assessment Lumbar Assessment: Within Functional Limits  Balance Balance Balance Assessed: Yes Static Sitting Balance Static Sitting - Balance Support: No upper extremity supported Static Sitting - Level of Assistance: 7: Independent Dynamic Sitting Balance Dynamic Sitting - Balance Support: No upper extremity supported Dynamic Sitting - Level of Assistance: 7: Independent Static Standing Balance Static Standing - Balance Support: Bilateral upper extremity supported Static Standing - Level of Assistance: 5: Stand by assistance Dynamic Standing Balance Dynamic Standing - Balance Support: Bilateral upper extremity supported Dynamic Standing - Level of Assistance: 5: Stand by assistance Extremity/Trunk Assessment RUE Assessment RUE Assessment: Exceptions to [facility] Passive Range of Motion (PROM) Comments: 0-150 degrees of shoulder flexion with pain reported at 100 degrees or  above Active Range of Motion (AROM) Comments: Pt demonstrates AROM 0-150 degrees in supine, increased trap hike compensation with 0-130 degrees if sitting.  All other joints AROM WFLs General Strength Comments: shoulder flexion 3/5, all other joints 4/5.  Increased pain at the glenohumeral joint with flexion greater than 100 degrees LUE Assessment LUE Assessment: Within Functional Limits General Strength Comments: shoulder 3+/5 all other joints 4/5 throughout   , OTR/L 05/07/2019, 5:33 PM

## 2019-05-08 DIAGNOSIS — K5901 Slow transit constipation: Secondary | ICD-10-CM

## 2019-05-08 DIAGNOSIS — S82141S Displaced bicondylar fracture of right tibia, sequela: Secondary | ICD-10-CM

## 2019-05-08 DIAGNOSIS — S8991XS Unspecified injury of right lower leg, sequela: Secondary | ICD-10-CM

## 2019-05-08 DIAGNOSIS — H539 Unspecified visual disturbance: Secondary | ICD-10-CM
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
## 2019-05-08 NOTE — Progress Notes (Signed)
Patient is scheduled for Discharge today. Patient transported off unit via wheelchair accompanied by this Probation officer and Significant Other. Patient discharged to Home with AVS, Personal belongings and Equipment (wheelchair, [facility] and walker) via private vehicle.

## 2019-05-11 ENCOUNTER — Telehealth: Payer: Self-pay | Admitting: Registered Nurse

## 2019-05-11 NOTE — Telephone Encounter (Signed)
Transitional Care call  Patient name: Eric [name]  DOB: 09-30-1970 1. Are you/is patient experiencing any problems since coming home? No a. Are there any questions regarding any aspect of care? No 2. Are there any questions regarding medications administration/dosing? No a. Are meds being taken as prescribed? Yes b. "Patient should review meds with caller to confirm"  3. Have there been any falls? No 4. Has Home Health been to the house and/or have they contacted you? Has an appointment with [facility]. a. If not, have you tried to contact them? NA b. Can we help you contact them? NA 5. Are bowels and bladder emptying properly? Yes a. Are there any unexpected incontinence issues? No b. If applicable, is patient following bowel/bladder programs? NA 6. Any fevers, problems with breathing, unexpected pain? No 7. Are there any skin problems or new areas of breakdown? No 8. Has the patient/family member arranged specialty MD follow up (ie cardiology/neurology/renal/surgical/etc.)?  [name] reports he has called to schedule HFU appointments, he hasn't received a return call. He was instructed to call our office if he doesn't receive a return call by 05/12/2019. He verbalizes understanding.  a. Can we help arrange? See above. 9. Does the patient need any other services or support that we can help arrange? No 10. Are caregivers following through as expected in assisting the patient? Yes 11. Has the patient quit smoking, drinking alcohol, or using drugs as recommended? [name] states he doesn't smoke, drink alcohol or use illicit drugs.   Appointment date/time 05/17/2019  arrival time 2:00 for 2:20 appointment with Dr. Posey Pronto. At [place]

## 2019-05-11 NOTE — Telephone Encounter (Signed)
TC Call placed to Mr. Redmon, unable to leave message, voice mail has not been set up. Placed a call to his girlfriend, not accepting calls at this time. Will send a e-mail to Ovidio Kin SW to see if she has another number for Mr. Pavek. Marland Kitchen

## 2019-05-17 ENCOUNTER — Encounter
Payer: Managed Care, Other (non HMO) | Attending: Physical Medicine & Rehabilitation | Admitting: Physical Medicine & Rehabilitation

## 2019-05-17 ENCOUNTER — Encounter: Payer: Self-pay | Admitting: Physical Medicine & Rehabilitation

## 2019-05-17 ENCOUNTER — Other Ambulatory Visit: Payer: Self-pay

## 2019-05-17 VITALS — BP 137/86 | HR 97 | Temp 97.7°F | Ht 69.0 in | Wt 230.0 lb

## 2019-05-17 DIAGNOSIS — T07XXXA Unspecified multiple injuries, initial encounter: Secondary | ICD-10-CM | POA: Diagnosis present

## 2019-05-17 DIAGNOSIS — H539 Unspecified visual disturbance: Secondary | ICD-10-CM

## 2019-05-17 DIAGNOSIS — M7551 Bursitis of right shoulder: Secondary | ICD-10-CM | POA: Diagnosis present

## 2019-05-17 DIAGNOSIS — M25511 Pain in right shoulder: Secondary | ICD-10-CM | POA: Diagnosis present

## 2019-05-17 DIAGNOSIS — M7552 Bursitis of left shoulder: Secondary | ICD-10-CM

## 2019-05-17 DIAGNOSIS — M25512 Pain in left shoulder: Secondary | ICD-10-CM | POA: Diagnosis present

## 2019-05-17 DIAGNOSIS — R269 Unspecified abnormalities of gait and mobility: Secondary | ICD-10-CM
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
## 2019-05-25 ENCOUNTER — Encounter (HOSPITAL_BASED_OUTPATIENT_CLINIC_OR_DEPARTMENT_OTHER): Payer: Self-pay | Admitting: *Deleted

## 2019-05-25 ENCOUNTER — Other Ambulatory Visit: Payer: Self-pay

## 2019-05-25 ENCOUNTER — Encounter: Payer: Self-pay | Admitting: Orthopedic Surgery

## 2019-05-25 ENCOUNTER — Ambulatory Visit (INDEPENDENT_AMBULATORY_CARE_PROVIDER_SITE_OTHER): Payer: Managed Care, Other (non HMO)

## 2019-05-25 ENCOUNTER — Telehealth: Payer: Self-pay

## 2019-05-25 ENCOUNTER — Ambulatory Visit (INDEPENDENT_AMBULATORY_CARE_PROVIDER_SITE_OTHER): Payer: Managed Care, Other (non HMO) | Admitting: Orthopedic Surgery

## 2019-05-25 VITALS — Ht 69.0 in | Wt 229.0 lb

## 2019-05-25 DIAGNOSIS — S83511A Sprain of anterior cruciate ligament of right knee, initial encounter: Secondary | ICD-10-CM

## 2019-05-25 DIAGNOSIS — S72402F Unspecified fracture of lower end of left femur, subsequent encounter for open fracture type IIIA, IIIB, or IIIC with routine healing: Secondary | ICD-10-CM

## 2019-05-25 DIAGNOSIS — M79605 Pain in left leg: Secondary | ICD-10-CM

## 2019-05-25 DIAGNOSIS — S83411A Sprain of medial collateral ligament of right knee, initial encounter: Secondary | ICD-10-CM

## 2019-05-25 NOTE — Telephone Encounter (Signed)
Patient called stating his attorney needs an out of work letter sent for the patient.
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
## 2019-05-26 ENCOUNTER — Encounter (HOSPITAL_BASED_OUTPATIENT_CLINIC_OR_DEPARTMENT_OTHER): Payer: Self-pay | Admitting: *Deleted

## 2019-05-26 NOTE — Telephone Encounter (Signed)
I can write a letter, but it will be more generic than the one he gets from Ortho regarding weight bearing status and follow up regarding such.  Thanks.

## 2019-05-27 ENCOUNTER — Telehealth: Payer: Self-pay | Admitting: Physical Medicine & Rehabilitation

## 2019-05-27 ENCOUNTER — Other Ambulatory Visit (HOSPITAL_COMMUNITY)
Admission: RE | Admit: 2019-05-27 | Discharge: 2019-05-27 | Disposition: A | Discharge status: Home / Self Care | Payer: Managed Care, Other (non HMO) | Source: Ambulatory Visit | Attending: Otolaryngology | Admitting: Otolaryngology

## 2019-05-27 DIAGNOSIS — Z20828 Contact with and (suspected) exposure to other viral communicable diseases: Secondary | ICD-10-CM | POA: Diagnosis not present

## 2019-05-27 DIAGNOSIS — Z01812 Encounter for preprocedural laboratory examination: Secondary | ICD-10-CM | POA: Insufficient documentation

## 2019-05-27 NOTE — Telephone Encounter (Signed)
Patient came to office today. He would like an out of work note stating he is under your care.

## 2019-05-27 NOTE — Telephone Encounter (Signed)
Work note provided.

## 2019-05-28 ENCOUNTER — Encounter (HOSPITAL_COMMUNITY): Payer: Self-pay | Admitting: Certified Registered"

## 2019-05-30 LAB — NOVEL CORONAVIRUS, NAA (HOSP ORDER, SEND-OUT TO REF LAB; TAT 18-24 HRS): SARS-CoV-2, NAA: NOT DETECTED

## 2019-05-31 ENCOUNTER — Ambulatory Visit (HOSPITAL_BASED_OUTPATIENT_CLINIC_OR_DEPARTMENT_OTHER)
Admission: RE | Admit: 2019-05-31 | Payer: Managed Care, Other (non HMO) | Source: Home / Self Care | Admitting: Otolaryngology

## 2019-05-31 SURGERY — REMOVAL, HARDWARE, MANDIBLE
Anesthesia: General | Laterality: Bilateral

## 2019-06-02 ENCOUNTER — Other Ambulatory Visit: Payer: Self-pay | Admitting: Physical Medicine and Rehabilitation

## 2019-06-05 ENCOUNTER — Ambulatory Visit (HOSPITAL_BASED_OUTPATIENT_CLINIC_OR_DEPARTMENT_OTHER)
Admission: RE | Admit: 2019-06-05 | Discharge: 2019-06-05 | Disposition: A | Discharge status: Home / Self Care | Payer: Managed Care, Other (non HMO) | Source: Ambulatory Visit | Attending: Orthopedic Surgery | Admitting: Orthopedic Surgery

## 2019-06-05 ENCOUNTER — Other Ambulatory Visit: Payer: Self-pay

## 2019-06-05 DIAGNOSIS — S83411A Sprain of medial collateral ligament of right knee, initial encounter: Secondary | ICD-10-CM | POA: Insufficient documentation

## 2019-06-05 DIAGNOSIS — S83511A Sprain of anterior cruciate ligament of right knee, initial encounter: Secondary | ICD-10-CM | POA: Insufficient documentation

## 2019-06-05 IMAGING — MR MR KNEE*R* W/O CM
7 series · 40 of 40 positions shown · non-contrast
Comparison: X-ray [DATE]. Recent tibial plateau fracture

CLINICAL DATA: Right knee pain and instability

EXAM:
MRI OF THE RIGHT KNEE WITHOUT CONTRAST
TECHNIQUE: Multiplanar, multisequence MR imaging of the knee was performed. No
intravenous contrast was administered.

[Series 3: T2 fat-sat · axial · 4.0mm · 0.62mm/px · z∈[-42,+77]mm · 6 of 25 slices shown (1 of 3)]
[im 1/25]
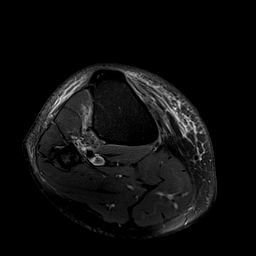
[im 5/25]
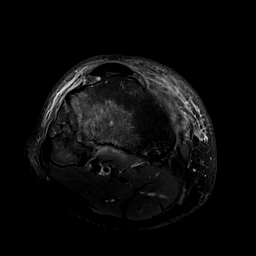
[im 10/25]
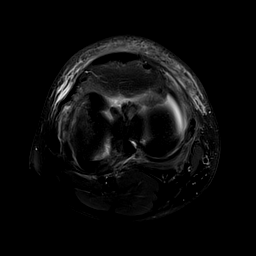
[im 15/25]
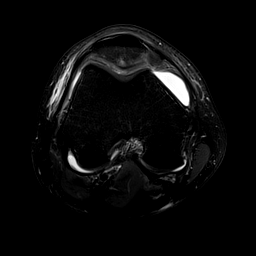
[im 20/25]
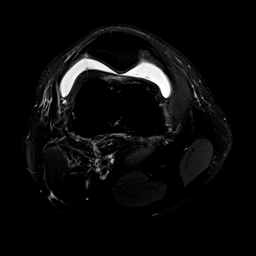
[im 25/25]
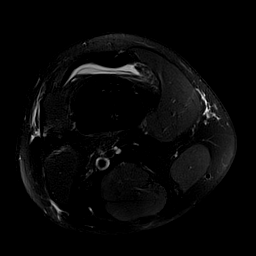

[Series 4: T1 · coronal · 4.0mm · 0.59mm/px · 5 of 24 slices shown]
[im 1/24]
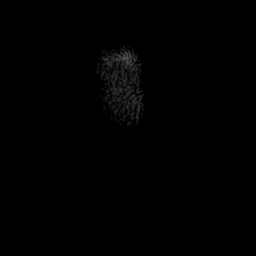
[im 6/24]
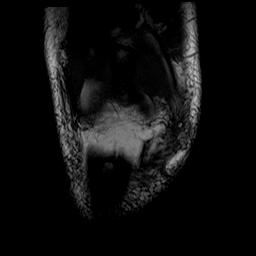
[im 12/24]
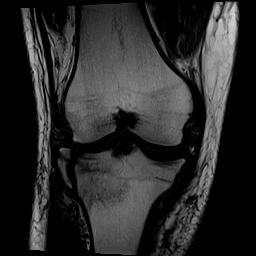
[im 18/24]
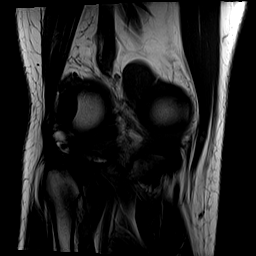
[im 24/24]
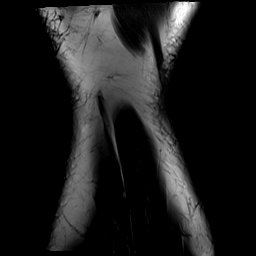

[Series 5: T2 fat-sat · coronal · 4.0mm · 0.59mm/px · 5 of 24 slices shown (2 of 3)]
[im 1/24]
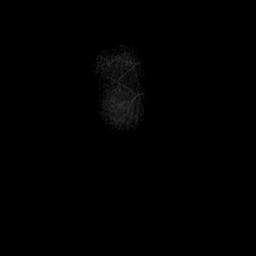
[im 6/24]
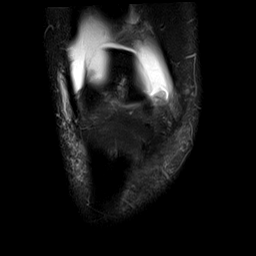
[im 12/24]
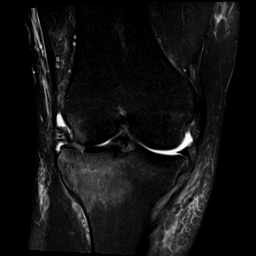
[im 18/24]
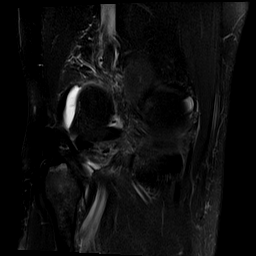
[im 24/24]
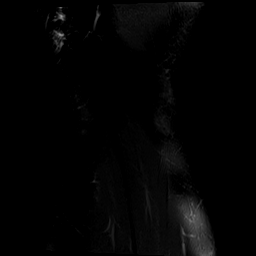

[Series 6: PD fat-sat · coronal · 3.0mm · 0.59mm/px · 7 of 30 slices shown (1 of 2)]
[im 1/30]
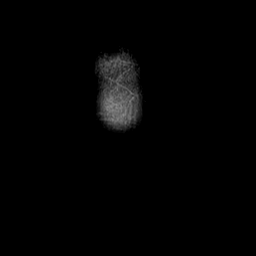
[im 5/30]
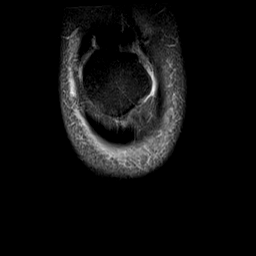
[im 10/30]
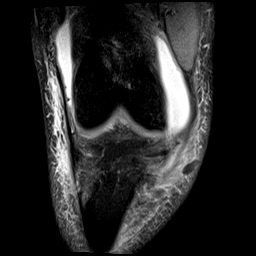
[im 15/30]
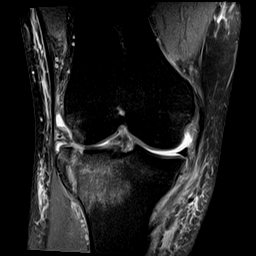
[im 20/30]
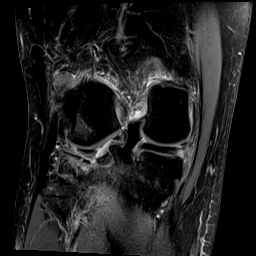
[im 25/30]
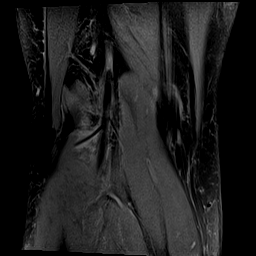
[im 30/30]
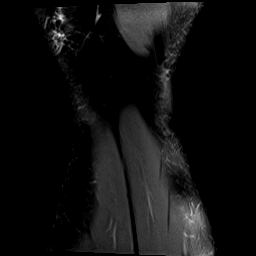

[Series 7: PD fat-sat · sagittal · 3.0mm · 0.59mm/px · 6 of 28 slices shown (2 of 2)]
[im 1/28]
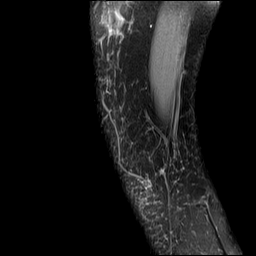
[im 6/28]
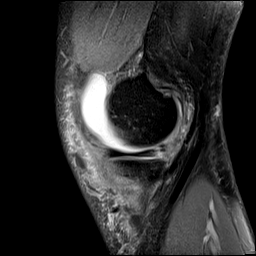
[im 11/28]
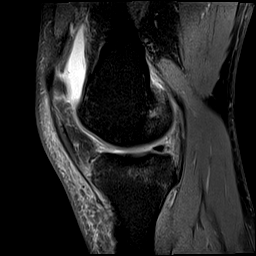
[im 17/28]
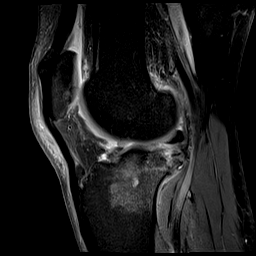
[im 22/28]
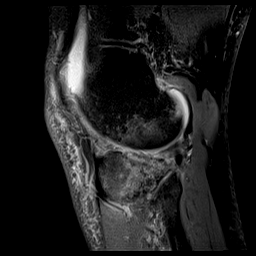
[im 28/28]
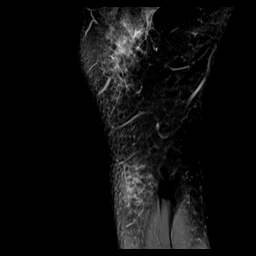

[Series 8: T2 fat-sat · sagittal · 3.0mm · 0.59mm/px · 6 of 28 slices shown (3 of 3)]
[im 1/28]
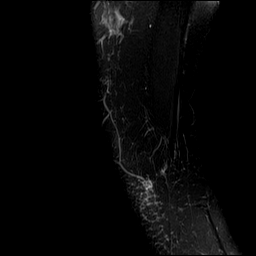
[im 6/28]
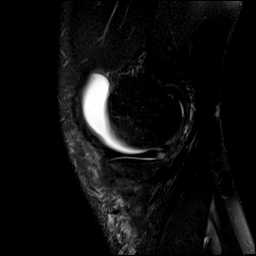
[im 11/28]
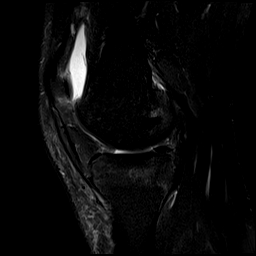
[im 17/28]
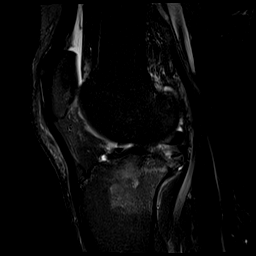
[im 22/28]
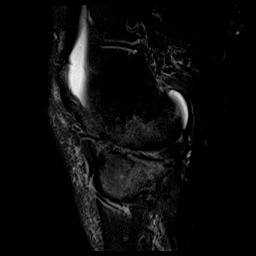
[im 28/28]
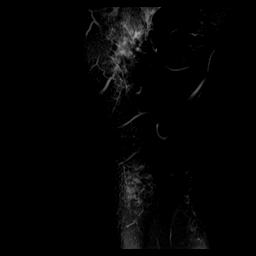

[Series 10: PD · oblique · 2.0mm · 0.59mm/px · 5 of 22 slices shown]
[im 1/22]
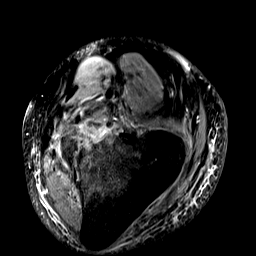
[im 6/22]
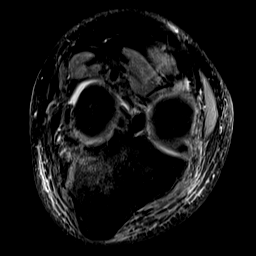
[im 11/22]
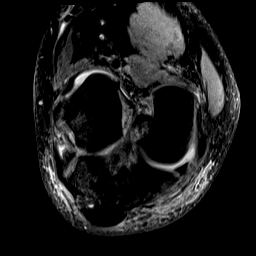
[im 16/22]
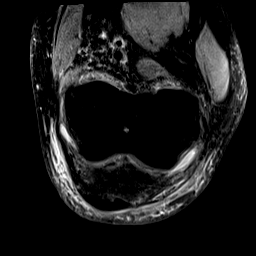
[im 22/22]
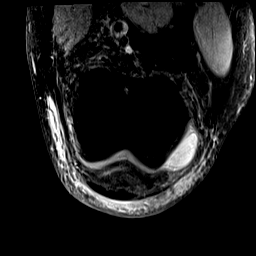

[40 of 40 positions shown; findings below may reference images not displayed]

FINDINGS: MENISCI

Medial meniscus: Vertical longitudinal tear involving the periphery
of the posterior horn and body segments of the medial meniscus
(series 7, images 6-11). Tear extends to the posterior root
attachment without evidence of displacement.

Lateral meniscus: Complete radial tear/transsection of the lateral
meniscus at the junction of the anterior horn and body (series 6,
image 19). The body segment is displaced posteriorly and is at least
partially adherent to posterior tibial plateau fracture component
(series 7, image 20).

LIGAMENTS

Cruciates:  Complete ACL tear. PCL intact.

Collaterals: Distal attachment of the medial collateral ligament is
heterogeneous and ill-defined suggesting grade 2-3 injury.
Iliotibial band insertion is intact on it to a minimally displaced
fracture component of the tibial plateau. Fibular collateral
ligament is ill-defined at its femoral attachment. Biceps femoris
attachment is intact.

CARTILAGE

Patellofemoral: Full-thickness cartilage fissure at the inferior
aspect of the patellar apex. No focal trochlear cartilage defect.

Medial:  No focal cartilage defect.

Lateral: Comminuted, mildly depressed tibial plateau fracture with
overlying cartilage discontinuity. No discrete lateral femoral
condyle cartilage defect.

Joint: Small to moderate joint effusion. Fat pads within normal
limits.

Popliteal Fossa: Distal popliteus tendon is intermediate in signal
intensity at its femoral attachment. No Baker's cyst.

Extensor Mechanism:  Intact quadriceps tendon and patellar tendon.

Bones: Comminuted, mildly depressed lateral tibial plateau fracture
with up to 5 mm of articular-surface diastasis. Nondisplaced
fracture of the fibular tip. Small impaction fracture at the
peripheral aspect of the lateral femoral condyle.

Other: Soft tissue swelling without focal hematoma.
IMPRESSION: 1. Comminuted, mildly depressed lateral tibial plateau fracture.
Additional nondisplaced fracture of the fibular tip and small
impaction fracture of the lateral femoral condyle.
2. Complete ACL tear.
3. Complete radial tear/transsection of the lateral meniscus at the
junction of the anterior horn and body.
4. Vertical longitudinal tear involving the periphery of the
posterior horn and body segments of the medial meniscus.
5. Grade 2-3 injury of the MCL.
6. Grade 2 injury of the proximal fibular collateral ligament.
7. Small to moderate joint effusion.

## 2019-06-07 ENCOUNTER — Telehealth: Payer: Self-pay | Admitting: Orthopedic Surgery

## 2019-06-07 ENCOUNTER — Encounter: Payer: Self-pay | Admitting: Orthopedic Surgery

## 2019-06-07 NOTE — Progress Notes (Signed)
Got it.  Thanks.  Lauren would you mind setting him up to be seen this week.  Thank you

## 2019-06-07 NOTE — Telephone Encounter (Signed)
I called patient and reviewed his MRI findings.  Discussed that he has rupture and instability of his MCL and ACL with an impaction fracture of the lateral femoral condyle lateral tibial plateau and tears of the medial and lateral meniscus.  Discussed that I will request consultation with Dr. Marlou Sa for knee reconstruction as we had discussed in the past.  Discussed that patient will still have arthritis pain due to the articular cartilage injury and meniscal injury.  Recommended avoiding any type of contact or dynamic exercising after knee reconstruction.  Discussed the patient will have arthritic pain and in the future may require total knee arthroplasty.

## 2019-06-08 ENCOUNTER — Encounter: Payer: Self-pay | Admitting: Family

## 2019-06-08 ENCOUNTER — Other Ambulatory Visit: Payer: Self-pay

## 2019-06-08 ENCOUNTER — Other Ambulatory Visit: Payer: Self-pay | Admitting: Physical Medicine and Rehabilitation

## 2019-06-08 ENCOUNTER — Ambulatory Visit (INDEPENDENT_AMBULATORY_CARE_PROVIDER_SITE_OTHER): Payer: Managed Care, Other (non HMO) | Admitting: Family

## 2019-06-08 ENCOUNTER — Encounter (HOSPITAL_BASED_OUTPATIENT_CLINIC_OR_DEPARTMENT_OTHER): Payer: Self-pay | Admitting: Otolaryngology

## 2019-06-08 ENCOUNTER — Ambulatory Visit: Payer: Self-pay

## 2019-06-08 VITALS — Ht 69.0 in | Wt 230.0 lb

## 2019-06-08 DIAGNOSIS — M79605 Pain in left leg: Secondary | ICD-10-CM

## 2019-06-08 DIAGNOSIS — S72402F Unspecified fracture of lower end of left femur, subsequent encounter for open fracture type IIIA, IIIB, or IIIC with routine healing: Secondary | ICD-10-CM
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
## 2019-06-09 ENCOUNTER — Ambulatory Visit: Payer: Managed Care, Other (non HMO) | Admitting: Orthopedic Surgery

## 2019-06-10 ENCOUNTER — Other Ambulatory Visit (HOSPITAL_COMMUNITY)
Admission: RE | Admit: 2019-06-10 | Discharge: 2019-06-10 | Disposition: A | Discharge status: Home / Self Care | Payer: Managed Care, Other (non HMO) | Source: Ambulatory Visit | Attending: Otolaryngology | Admitting: Otolaryngology

## 2019-06-10 DIAGNOSIS — Z20828 Contact with and (suspected) exposure to other viral communicable diseases: Secondary | ICD-10-CM | POA: Diagnosis not present

## 2019-06-10 DIAGNOSIS — Z01812 Encounter for preprocedural laboratory examination: Secondary | ICD-10-CM | POA: Diagnosis present

## 2019-06-11 ENCOUNTER — Other Ambulatory Visit: Payer: Self-pay | Admitting: Physical Medicine and Rehabilitation

## 2019-06-11 ENCOUNTER — Ambulatory Visit (INDEPENDENT_AMBULATORY_CARE_PROVIDER_SITE_OTHER): Payer: Managed Care, Other (non HMO) | Admitting: Orthopedic Surgery

## 2019-06-11 ENCOUNTER — Other Ambulatory Visit: Payer: Self-pay

## 2019-06-11 ENCOUNTER — Encounter: Payer: Self-pay | Admitting: Orthopedic Surgery

## 2019-06-11 VITALS — Ht 70.0 in | Wt 230.0 lb

## 2019-06-11 DIAGNOSIS — S83511A Sprain of anterior cruciate ligament of right knee, initial encounter: Secondary | ICD-10-CM

## 2019-06-11 DIAGNOSIS — S83411A Sprain of medial collateral ligament of right knee, initial encounter: Secondary | ICD-10-CM

## 2019-06-11 DIAGNOSIS — M79605 Pain in left leg: Secondary | ICD-10-CM

## 2019-06-11 LAB — NOVEL CORONAVIRUS, NAA (HOSP ORDER, SEND-OUT TO REF LAB; TAT 18-24 HRS): SARS-CoV-2, NAA: NOT DETECTED

## 2019-06-11 NOTE — Telephone Encounter (Signed)
He did not state that he needs it.  Thanks.

## 2019-06-11 NOTE — Telephone Encounter (Signed)
Refill request for Trazodone. Is it okay for patient to refill? No indication in note for patient to continue.
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
## 2019-06-14 ENCOUNTER — Encounter (HOSPITAL_BASED_OUTPATIENT_CLINIC_OR_DEPARTMENT_OTHER): Payer: Self-pay | Admitting: Otolaryngology

## 2019-06-14 ENCOUNTER — Ambulatory Visit (HOSPITAL_BASED_OUTPATIENT_CLINIC_OR_DEPARTMENT_OTHER): Payer: Managed Care, Other (non HMO) | Admitting: Anesthesiology

## 2019-06-14 ENCOUNTER — Other Ambulatory Visit: Payer: Self-pay

## 2019-06-14 ENCOUNTER — Encounter (HOSPITAL_BASED_OUTPATIENT_CLINIC_OR_DEPARTMENT_OTHER)
Admission: RE | Disposition: A | Discharge status: Home / Self Care | Payer: Self-pay | Source: Home / Self Care | Attending: Otolaryngology

## 2019-06-14 ENCOUNTER — Ambulatory Visit (HOSPITAL_BASED_OUTPATIENT_CLINIC_OR_DEPARTMENT_OTHER)
Admission: RE | Admit: 2019-06-14 | Discharge: 2019-06-14 | Disposition: A | Discharge status: Home / Self Care | Payer: Managed Care, Other (non HMO) | Attending: Otolaryngology | Admitting: Otolaryngology

## 2019-06-14 DIAGNOSIS — I10 Essential (primary) hypertension: Secondary | ICD-10-CM | POA: Diagnosis not present

## 2019-06-14 DIAGNOSIS — Z472 Encounter for removal of internal fixation device: Secondary | ICD-10-CM | POA: Diagnosis present

## 2019-06-14 HISTORY — PX: MANDIBULAR HARDWARE REMOVAL: SHX5205

## 2019-06-14 SURGERY — REMOVAL, HARDWARE, MANDIBLE
Anesthesia: General | Site: Mouth

## 2019-06-14 MED ORDER — LIDOCAINE 2% (20 MG/ML) 5 ML SYRINGE
INTRAMUSCULAR | Status: DC | PRN
  Administered 2019-06-14: 40 mg via INTRAVENOUS

## 2019-06-14 MED ORDER — FENTANYL CITRATE (PF) 100 MCG/2ML IJ SOLN: 50.0000 ug | INTRAMUSCULAR | Status: DC | PRN

## 2019-06-14 MED ORDER — MIDAZOLAM HCL 2 MG/2ML IJ SOLN
INTRAMUSCULAR | Status: AC
  Filled 2019-06-14: qty 2

## 2019-06-14 MED ORDER — FENTANYL CITRATE (PF) 250 MCG/5ML IJ SOLN
INTRAMUSCULAR | Status: DC | PRN
  Administered 2019-06-14 (×2): 50 ug via INTRAVENOUS

## 2019-06-14 MED ORDER — LIDOCAINE-EPINEPHRINE 1 %-1:100000 IJ SOLN
INTRAMUSCULAR | Status: AC
  Filled 2019-06-14: qty 1

## 2019-06-14 MED ORDER — MIDAZOLAM HCL 5 MG/5ML IJ SOLN
INTRAMUSCULAR | Status: DC | PRN
  Administered 2019-06-14: 2 mg via INTRAVENOUS

## 2019-06-14 MED ORDER — ACETAMINOPHEN 500 MG PO TABS: 1000.0000 mg | ORAL_TABLET | Freq: Once | ORAL | Status: DC

## 2019-06-14 MED ORDER — PROPOFOL 10 MG/ML IV BOLUS
INTRAVENOUS | Status: AC
  Filled 2019-06-14: qty 20

## 2019-06-14 MED ORDER — LIDOCAINE-EPINEPHRINE 1 %-1:100000 IJ SOLN
INTRAMUSCULAR | Status: DC | PRN
  Administered 2019-06-14: 3 mL

## 2019-06-14 MED ORDER — MIDAZOLAM HCL 2 MG/2ML IJ SOLN: 1.0000 mg | INTRAMUSCULAR | Status: DC | PRN

## 2019-06-14 MED ORDER — BACITRACIN ZINC 500 UNIT/GM EX OINT
TOPICAL_OINTMENT | CUTANEOUS | Status: AC
  Filled 2019-06-14: qty 28.35

## 2019-06-14 MED ORDER — ONDANSETRON HCL 4 MG/2ML IJ SOLN
INTRAMUSCULAR | Status: AC
  Filled 2019-06-14: qty 2

## 2019-06-14 MED ORDER — FENTANYL CITRATE (PF) 100 MCG/2ML IJ SOLN
INTRAMUSCULAR | Status: AC
  Filled 2019-06-14: qty 2

## 2019-06-14 MED ORDER — PROMETHAZINE HCL 25 MG/ML IJ SOLN: 6.2500 mg | INTRAMUSCULAR | Status: DC | PRN

## 2019-06-14 MED ORDER — ONDANSETRON HCL 4 MG/2ML IJ SOLN
INTRAMUSCULAR | Status: DC | PRN
  Administered 2019-06-14: 4 mg via INTRAVENOUS

## 2019-06-14 MED ORDER — LIDOCAINE 2% (20 MG/ML) 5 ML SYRINGE
INTRAMUSCULAR | Status: AC
  Filled 2019-06-14: qty 5

## 2019-06-14 MED ORDER — FENTANYL CITRATE (PF) 100 MCG/2ML IJ SOLN: 25.0000 ug | INTRAMUSCULAR | Status: DC | PRN

## 2019-06-14 MED ORDER — LACTATED RINGERS IV SOLN: INTRAVENOUS | Status: DC

## 2019-06-14 MED ORDER — PROPOFOL 10 MG/ML IV BOLUS
INTRAVENOUS | Status: DC | PRN
  Administered 2019-06-14: 30 mg via INTRAVENOUS
  Administered 2019-06-14: 20 mg via INTRAVENOUS
  Administered 2019-06-14: 50 mg via INTRAVENOUS
  Administered 2019-06-14: 30 mg via INTRAVENOUS

## 2019-06-14 SURGICAL SUPPLY — 30 items
BLADE SURG 15 STRL LF DISP TIS (BLADE) ×1 IMPLANT
BLADE SURG 15 STRL SS (BLADE) ×2
CANISTER SUCT 1200ML W/VALVE (MISCELLANEOUS) ×3 IMPLANT
COVER MAYO STAND REUSABLE (DRAPES) ×3 IMPLANT
COVER WAND RF STERILE (DRAPES) IMPLANT
DECANTER SPIKE VIAL GLASS SM (MISCELLANEOUS) ×3 IMPLANT
DRAPE HALF SHEET 70X43 (DRAPES) ×3 IMPLANT
ELECT COATED BLADE 2.86 ST (ELECTRODE) IMPLANT
ELECT REM PT RETURN 9FT ADLT (ELECTROSURGICAL)
ELECTRODE REM PT RTRN 9FT ADLT (ELECTROSURGICAL) IMPLANT
GAUZE 4X4 16PLY RFD (DISPOSABLE) IMPLANT
GLOVE BIO SURGEON STRL SZ 6.5 (GLOVE) ×2 IMPLANT
GLOVE BIO SURGEONS STRL SZ 6.5 (GLOVE) ×1
GLOVE ECLIPSE 7.5 STRL STRAW (GLOVE) ×3 IMPLANT
GOWN STRL REUS W/ TWL LRG LVL3 (GOWN DISPOSABLE) ×1 IMPLANT
GOWN STRL REUS W/TWL LRG LVL3 (GOWN DISPOSABLE) ×2
MARKER SKIN DUAL TIP RULER LAB (MISCELLANEOUS) IMPLANT
NEEDLE PRECISIONGLIDE 27X1.5 (NEEDLE) ×3 IMPLANT
NS IRRIG 1000ML POUR BTL (IV SOLUTION) IMPLANT
PACK BASIN DAY SURGERY FS (CUSTOM PROCEDURE TRAY) ×3 IMPLANT
PENCIL FOOT CONTROL (ELECTRODE) IMPLANT
SCISSORS WIRE ANG 4 3/4 DISP (INSTRUMENTS) IMPLANT
SUT CHROMIC 3 0 PS 2 (SUTURE) IMPLANT
SUT CHROMIC 4 0 PS 2 18 (SUTURE) IMPLANT
SYR CONTROL 10ML LL (SYRINGE) ×3 IMPLANT
TOWEL GREEN STERILE FF (TOWEL DISPOSABLE) ×6 IMPLANT
TRAY DSU PREP LF (CUSTOM PROCEDURE TRAY) IMPLANT
TUBE CONNECTING 20'X1/4 (TUBING) ×1
TUBE CONNECTING 20X1/4 (TUBING) ×2 IMPLANT
YANKAUER SUCT BULB TIP NO VENT (SUCTIONS) ×3 IMPLANT

## 2019-06-14 NOTE — Interval H&P Note (Signed)
History and Physical Interval Note:  06/14/2019 7:13 AM  Eric [name]  has presented today for surgery, with the diagnosis of removal of hardware due to jaw fracture.  The various methods of treatment have been discussed with the patient and family. After consideration of risks, benefits and other options for treatment, the patient has consented to  Procedure(s): MANDIBULAR HARDWARE REMOVAL (N/A) as a surgical intervention.  The patient's history has been reviewed, patient examined, no change in status, stable for surgery.  I have reviewed the patient's chart and labs.  Questions were answered to the patient's satisfaction.     [name]

## 2019-06-14 NOTE — Anesthesia Procedure Notes (Signed)
Procedure Name: MAC Date/Time: 06/14/2019 7:35 AM Performed by: Renato Shin, CRNA Pre-anesthesia Checklist: Patient identified, Emergency Drugs available, Suction available and Patient being monitored Patient Re-evaluated:Patient Re-evaluated prior to induction Oxygen Delivery Method: Nasal cannula Preoxygenation: Pre-oxygenation with 100% oxygen Induction Type: IV induction Placement Confirmation: positive ETCO2 and breath sounds checked- equal and bilateral Dental Injury: Teeth and Oropharynx as per pre-operative assessment

## 2019-06-14 NOTE — Transfer of Care (Signed)
Immediate Anesthesia Transfer of Care Note  Patient: Eric [name]  Procedure(s) Performed: MANDIBULAR HARDWARE REMOVAL (N/A Mouth)  Patient Location: PACU  Anesthesia Type:MAC  Level of Consciousness: awake, alert  and patient cooperative  Airway & Oxygen Therapy: Patient Spontanous Breathing and Patient connected to nasal cannula oxygen  Post-op Assessment: Report given to RN and Post -op Vital signs reviewed and stable  Post vital signs: Reviewed and stable  Last Vitals:  Vitals Value Taken Time  BP    Temp    Pulse    Resp    SpO2      Last Pain:  Vitals:   06/14/19 0713  TempSrc: Oral  PainSc: 3       Patients Stated Pain Goal: 3 (75/10/25 8527)  Complications: No apparent anesthesia complications

## 2019-06-14 NOTE — Discharge Instructions (Signed)
Resume normal diet.  Rinse mouth with salt water 3-4 times daily, more if desired.  Tylenol/Motrin as needed for pain.

## 2019-06-14 NOTE — Op Note (Signed)
OPERATIVE REPORT  DATE OF SURGERY: 06/14/2019  PATIENT:  Eric [name],  48 y.o. male  PRE-OPERATIVE DIAGNOSIS:  removal of hardware due to jaw fracture  POST-OPERATIVE DIAGNOSIS:  removal of hardware due to jaw fracture  PROCEDURE:  Procedure(s): Maxillary HARDWARE REMOVAL  SURGEON:  Beckie Salts, MD  ASSISTANTS: none  ANESTHESIA:   General   EBL: 20 ml  DRAINS: none  LOCAL MEDICATIONS USED:  1% Xylocaine with epinephrine  SPECIMEN:  none  COUNTS:  Correct  PROCEDURE DETAILS: The patient was taken to the operating room and placed on the operating table in the supine position. . Following induction of intravenous sedation, local anesthetic was infiltrated along the upper  gingival mucosa. All of the [name] were identified, uncovered by mucosal overgrowth and removed with screwdriver. The upper arch bar was then completely removed. The oral cavity was irrigated with saline and suctioned. The patient was awakened from sedation and transferred to recovery in stable condition.    PATIENT DISPOSITION:  To PACU, stable

## 2019-06-14 NOTE — Anesthesia Postprocedure Evaluation (Signed)
Anesthesia Post Note  Patient: Eric [name]  Procedure(s) Performed: MANDIBULAR HARDWARE REMOVAL (N/A Mouth)     Patient location during evaluation: PACU Anesthesia Type: General Level of consciousness: sedated Pain management: pain level controlled Vital Signs Assessment: post-procedure vital signs reviewed and stable Respiratory status: spontaneous breathing and respiratory function stable Cardiovascular status: stable Postop Assessment: no apparent nausea or vomiting Anesthetic complications: no    Last Vitals:  Vitals:   06/14/19 0830 06/14/19 0845  BP:  (!) 151/90  Pulse: 67 93  Resp: 11 14  Temp:  36.8 C  SpO2: 99% 98%    Last Pain:  Vitals:   06/14/19 0845  TempSrc:   PainSc: 0-No pain                 Trixie Maclaren DANIEL

## 2019-06-22 ENCOUNTER — Other Ambulatory Visit: Payer: Self-pay

## 2019-06-22 ENCOUNTER — Encounter: Payer: Self-pay | Admitting: Physical Medicine & Rehabilitation

## 2019-06-22 ENCOUNTER — Encounter: Payer: Managed Care, Other (non HMO) | Admitting: Physical Medicine & Rehabilitation

## 2019-06-22 ENCOUNTER — Encounter
Payer: Managed Care, Other (non HMO) | Attending: Physical Medicine & Rehabilitation | Admitting: Physical Medicine & Rehabilitation

## 2019-06-22 VITALS — BP 140/89 | HR 106 | Temp 97.7°F | Ht 70.0 in | Wt 223.0 lb

## 2019-06-22 DIAGNOSIS — H539 Unspecified visual disturbance: Secondary | ICD-10-CM | POA: Diagnosis present

## 2019-06-22 DIAGNOSIS — M7552 Bursitis of left shoulder: Secondary | ICD-10-CM | POA: Diagnosis present

## 2019-06-22 DIAGNOSIS — M25512 Pain in left shoulder: Secondary | ICD-10-CM | POA: Insufficient documentation

## 2019-06-22 DIAGNOSIS — R269 Unspecified abnormalities of gait and mobility: Secondary | ICD-10-CM | POA: Insufficient documentation

## 2019-06-22 DIAGNOSIS — T07XXXA Unspecified multiple injuries, initial encounter: Secondary | ICD-10-CM | POA: Diagnosis present

## 2019-06-22 DIAGNOSIS — M25511 Pain in right shoulder: Secondary | ICD-10-CM | POA: Diagnosis present

## 2019-06-22 DIAGNOSIS — M7551 Bursitis of right shoulder: Secondary | ICD-10-CM | POA: Insufficient documentation

## 2019-06-22 NOTE — Progress Notes (Signed)
Ultrasound guided subacromial shoulder injection, bilateral   Indication:Shoulder pain not relieved by medication management and other conservative care.  Informed consent was obtained after describing risks and benefits of the procedure with the patient, this includes bleeding, bruising, infection and medication side effects. The patient wishes to proceed and has given written consent. Patient was placed in a seated position with the ipsilateral hand placed in a dependent position. Bilateral shoulder were marked and prepped with betadine in the subacromial area. Using ultrasound tranducer was placed inferior and lateral to the acromian.  The subacromial bursa was visualized.  Vapocoolant spray was applied.  A 22-gauge 2 inch needle was inserted into the subacromial area under with visualization until the needle was in the joint space. After negative draw back for blood, a solution containing 1 mL of 6 mg per ML betamethasone and 4 mL of 1% lidocaine was injected (x2). A band aid was applied. The patient tolerated the procedure well. Post procedure instructions were given.

## 2019-06-24 ENCOUNTER — Ambulatory Visit: Payer: Managed Care, Other (non HMO) | Admitting: Orthopedic Surgery

## 2019-06-26 ENCOUNTER — Ambulatory Visit (HOSPITAL_BASED_OUTPATIENT_CLINIC_OR_DEPARTMENT_OTHER)
Admission: RE | Admit: 2019-06-26 | Discharge: 2019-06-26 | Disposition: A | Discharge status: Home / Self Care | Payer: BC Managed Care – PPO | Source: Ambulatory Visit | Attending: Orthopedic Surgery | Admitting: Orthopedic Surgery

## 2019-06-26 ENCOUNTER — Other Ambulatory Visit: Payer: Self-pay

## 2019-06-26 DIAGNOSIS — M79605 Pain in left leg: Secondary | ICD-10-CM | POA: Insufficient documentation

## 2019-06-26 IMAGING — MR MR KNEE*L* W/O CM
7 series · 40 of 40 positions shown · non-contrast
Comparison: X-ray [DATE]

CLINICAL DATA: Knee pain and instability. Distal femoral fracture
with ORIF in [DATE]

EXAM:
MRI OF THE LEFT KNEE WITHOUT CONTRAST
TECHNIQUE: Multiplanar, multisequence MR imaging of the knee was performed. No
intravenous contrast was administered.

[Series 3: T1 · axial · 4.0mm · 0.62mm/px · z∈[-84,+81]mm · 8 of 34 slices shown (1 of 2)]
[im 1/34]
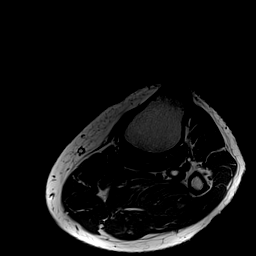
[im 5/34]
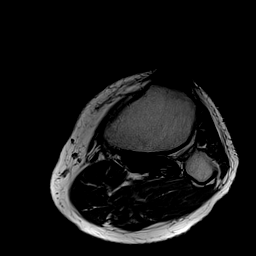
[im 10/34]
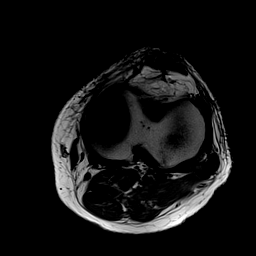
[im 15/34]
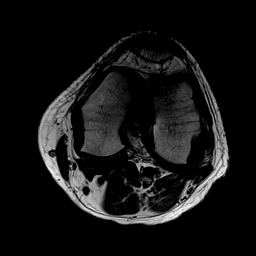
[im 19/34]
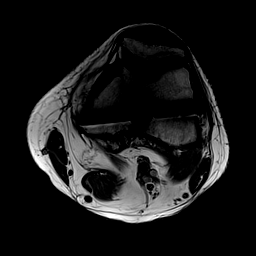
[im 24/34]
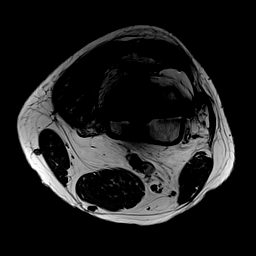
[im 29/34]
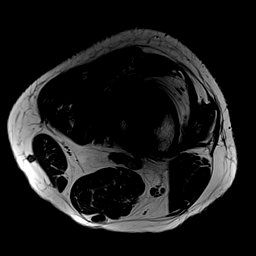
[im 34/34]
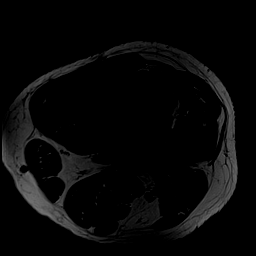

[Series 4: T2 · axial · 4.0mm · 0.62mm/px · z∈[-84,+81]mm · 7 of 34 slices shown (1 of 2)]
[im 1/34]
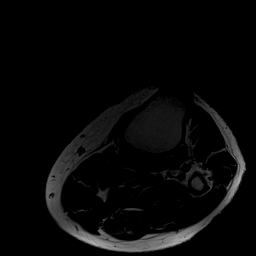
[im 6/34]
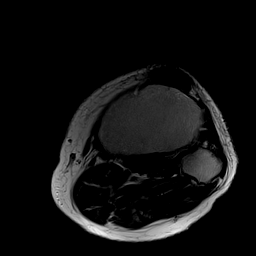
[im 12/34]
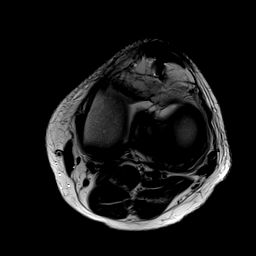
[im 17/34]
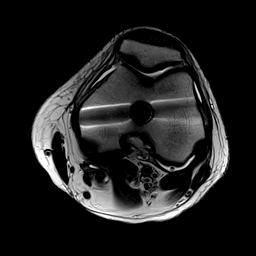
[im 23/34]
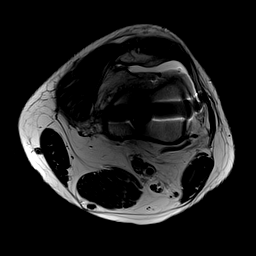
[im 28/34]
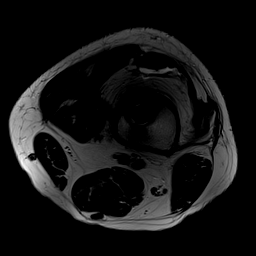
[im 34/34]
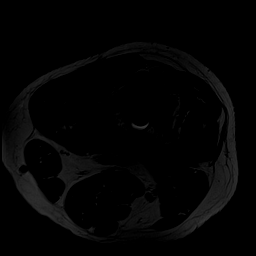

[Series 5: STIR · axial · 4.0mm · 0.62mm/px · z∈[-84,+81]mm · 7 of 34 slices shown (1 of 2)]
[im 1/34]
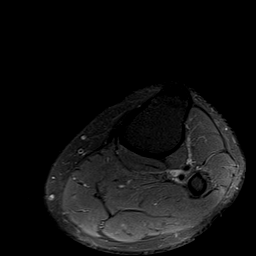
[im 6/34]
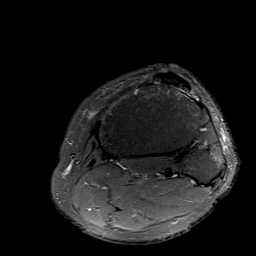
[im 12/34]
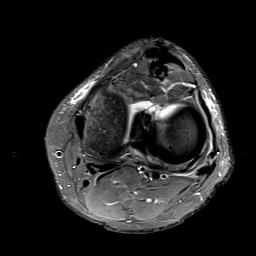
[im 17/34]
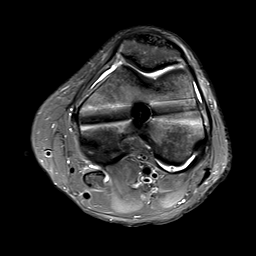
[im 23/34]
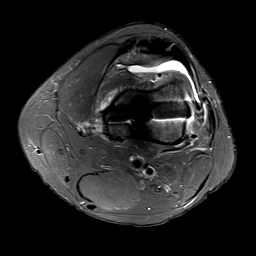
[im 28/34]
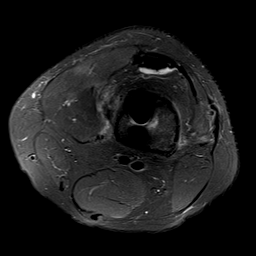
[im 34/34]
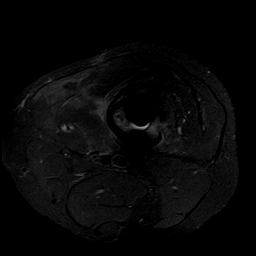

[Series 6: T1 · coronal · 4.0mm · 0.62mm/px · 5 of 26 slices shown (2 of 2)]
[im 1/26]
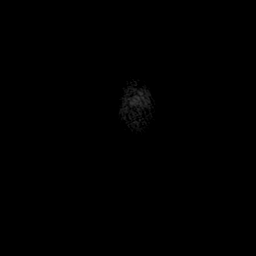
[im 7/26]
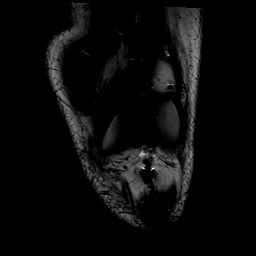
[im 13/26]
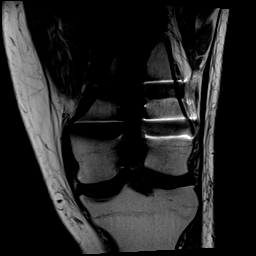
[im 19/26]
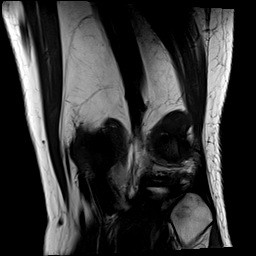
[im 26/26]
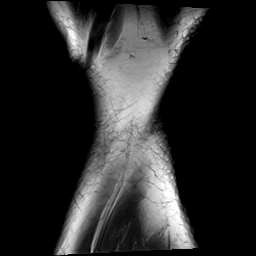

[Series 7: STIR · coronal · 4.0mm · 0.62mm/px · 5 of 26 slices shown (2 of 2)]
[im 1/26]
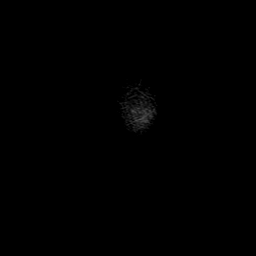
[im 7/26]
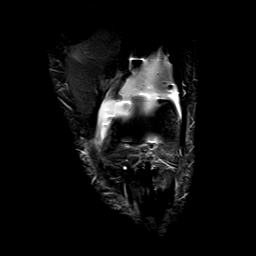
[im 13/26]
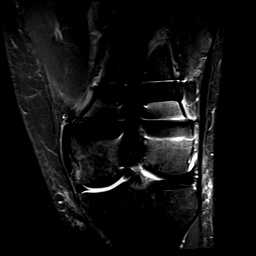
[im 19/26]
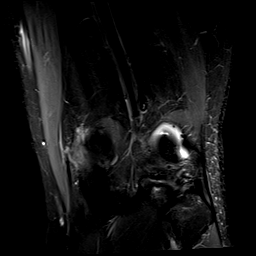
[im 26/26]
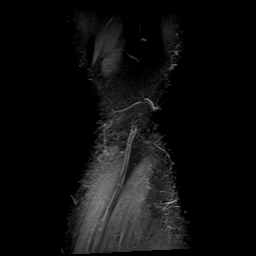

[Series 8: T2 · sagittal · 3.0mm · 0.70mm/px · 7 of 32 slices shown (2 of 2)]
[im 1/32]
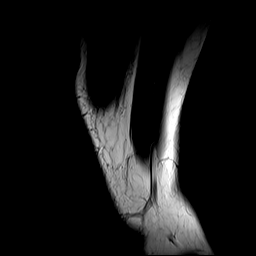
[im 6/32]
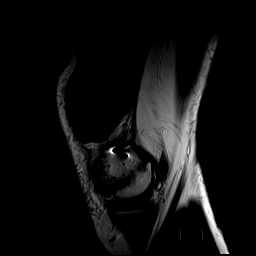
[im 11/32]
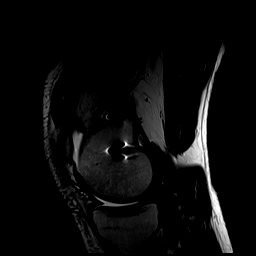
[im 16/32]
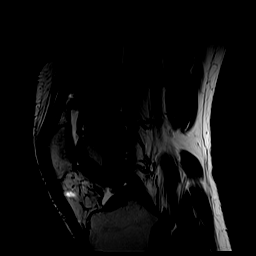
[im 21/32]
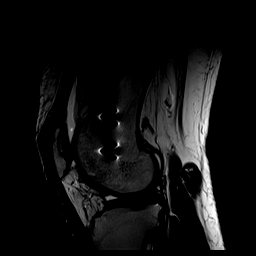
[im 26/32]
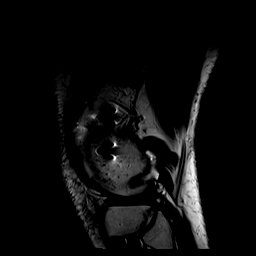
[im 32/32]
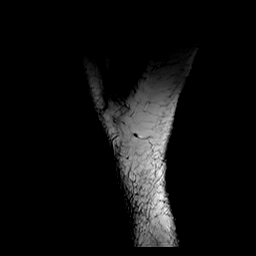

[Series 100: hx · axial · 8.0mm · 0.68mm/px · 1 of 7 slices shown]
[im 1/7]
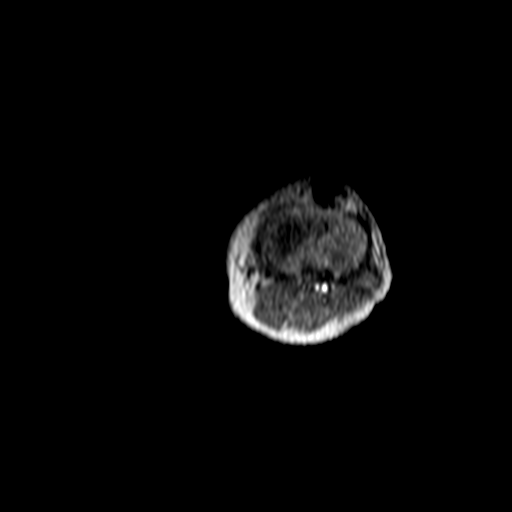

[40 of 40 positions shown; findings below may reference images not displayed]

FINDINGS: MENISCI

Medial meniscus:  Grossly intact.

Lateral meniscus:  Grossly intact.

LIGAMENTS

Cruciates:  Intact ACL and PCL.

Collaterals: Medial collateral ligament is intact. Lateral
collateral ligament complex is intact.

CARTILAGE

Patellofemoral:  No chondral defect.

Medial:  No chondral defect.

Lateral:  No chondral defect.

Joint: Small knee joint effusion. Postsurgical changes within
Hoffa's fat. Scattered foci of metallic susceptibility artifact
related to ORIF.

Popliteal Fossa:  Tiny Baker's cyst. Intact popliteus tendon.

Extensor Mechanism:  Intact quadriceps tendon and patellar tendon.

Bones: Metallic susceptibility artifact related to intramedullary
rod and interlocking screws within the visualized portion of the
distal femur traversing a transversely oriented fracture of the
distal femoral metadiaphysis which remains laterally displaced by
approximately 7 mm, which is unchanged compared to postoperative
radiographs. There is some edema-like bone marrow signal at the
fracture site. No bridging bone formation is identified within the
limitations of this study.

Other: Mild intramuscular edema within the vastus medialis and
vastus lateralis musculature. No discrete fluid collection at the
fracture site
IMPRESSION: 1. No significant interval change in the appearance of the
transversely oriented fracture of the distal femoral metadiaphysis.
No bridging bone formation is identified within the limitations of
this study. There is some residual bone marrow edema fracture site,
which could reflect changes related to healing although reactive
changes related to abnormal motion at the fracture site could also
result in this appearance. Changes related to infection, although
felt to be less likely are not entirely excluded. Correlation with
serum inflammatory markers is suggested.
2. Mild intramuscular edema within the vastus medialis and vastus
lateralis musculature, which may be reactive.
3. Small knee joint effusion. Tiny Baker's cyst.
4. Intact menisci, cruciate and collateral ligaments.

## 2019-06-27 ENCOUNTER — Encounter: Payer: Self-pay | Admitting: Orthopedic Surgery

## 2019-06-28 ENCOUNTER — Ambulatory Visit (INDEPENDENT_AMBULATORY_CARE_PROVIDER_SITE_OTHER): Payer: BC Managed Care – PPO | Admitting: Physician Assistant

## 2019-06-28 ENCOUNTER — Encounter: Payer: Self-pay | Admitting: Physician Assistant

## 2019-06-28 ENCOUNTER — Other Ambulatory Visit: Payer: Self-pay

## 2019-06-28 ENCOUNTER — Ambulatory Visit: Payer: Self-pay

## 2019-06-28 VITALS — Ht 70.0 in | Wt 223.0 lb

## 2019-06-28 DIAGNOSIS — M79605 Pain in left leg: Secondary | ICD-10-CM | POA: Diagnosis not present
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
## 2019-06-29 ENCOUNTER — Other Ambulatory Visit: Payer: Managed Care, Other (non HMO)

## 2019-06-30 ENCOUNTER — Telehealth: Payer: Self-pay | Admitting: Orthopedic Surgery

## 2019-06-30 NOTE — Telephone Encounter (Signed)
Mr. Eric [name] states he is unsure of where his care stands right now.  He would like to be treated by Dr. August Saucer, but was last seen by [place], PA-C this past Monday.  His impression is that he needs surgery on his knee, but that Dr. August Saucer requested another MRI which has been done.  He is confused and needs to know what his next step is because he is out of work for his knee at this time.  Please call to discuss.

## 2019-06-30 NOTE — Telephone Encounter (Signed)
Please advise. Thanks.  

## 2019-07-06 ENCOUNTER — Encounter: Payer: Self-pay | Admitting: Physical Medicine & Rehabilitation

## 2019-07-06 ENCOUNTER — Other Ambulatory Visit: Payer: Self-pay

## 2019-07-06 ENCOUNTER — Encounter
Payer: BC Managed Care – PPO | Attending: Physical Medicine & Rehabilitation | Admitting: Physical Medicine & Rehabilitation

## 2019-07-06 VITALS — BP 128/88 | HR 105 | Temp 97.7°F | Ht 70.0 in | Wt 226.0 lb

## 2019-07-06 DIAGNOSIS — T07XXXA Unspecified multiple injuries, initial encounter: Secondary | ICD-10-CM | POA: Diagnosis not present

## 2019-07-06 DIAGNOSIS — M7551 Bursitis of right shoulder: Secondary | ICD-10-CM | POA: Diagnosis present

## 2019-07-06 DIAGNOSIS — R269 Unspecified abnormalities of gait and mobility: Secondary | ICD-10-CM

## 2019-07-06 DIAGNOSIS — M7552 Bursitis of left shoulder: Secondary | ICD-10-CM

## 2019-07-06 DIAGNOSIS — M25512 Pain in left shoulder: Secondary | ICD-10-CM | POA: Diagnosis present

## 2019-07-06 DIAGNOSIS — H539 Unspecified visual disturbance: Secondary | ICD-10-CM | POA: Diagnosis present

## 2019-07-06 DIAGNOSIS — M25511 Pain in right shoulder: Secondary | ICD-10-CM | POA: Diagnosis present
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
## 2019-07-09 NOTE — Telephone Encounter (Signed)
I s/w patient he will keep his appt that he has scheduled for 07/16/19 to discuss with Dr August Saucer.

## 2019-07-09 NOTE — Telephone Encounter (Signed)
Fx not healed yet which may be accounting for his sense of instability in the left leg.  We can address that right knee once but left leg is healed and can be a good stable platform for him to recover with the right.  The right leg when I examined him a couple weeks ago was bothering him some but not as much as the left leg.  It would be good to see him in a couple of weeks at which time left femur fracture should be healed enough to consider intervention on the right leg.  I really think he needs to get his left leg feeling better before we deal with the right leg.

## 2019-07-10 ENCOUNTER — Other Ambulatory Visit: Payer: Self-pay | Admitting: Physical Medicine & Rehabilitation

## 2019-07-10 ENCOUNTER — Other Ambulatory Visit: Payer: Self-pay

## 2019-07-10 ENCOUNTER — Ambulatory Visit (HOSPITAL_BASED_OUTPATIENT_CLINIC_OR_DEPARTMENT_OTHER)
Admission: RE | Admit: 2019-07-10 | Discharge: 2019-07-10 | Disposition: A | Discharge status: Home / Self Care | Payer: BC Managed Care – PPO | Source: Ambulatory Visit | Attending: Physical Medicine & Rehabilitation | Admitting: Physical Medicine & Rehabilitation

## 2019-07-10 DIAGNOSIS — M25511 Pain in right shoulder: Secondary | ICD-10-CM

## 2019-07-10 DIAGNOSIS — M7551 Bursitis of right shoulder: Secondary | ICD-10-CM

## 2019-07-10 DIAGNOSIS — M7552 Bursitis of left shoulder: Secondary | ICD-10-CM | POA: Diagnosis present

## 2019-07-10 DIAGNOSIS — T07XXXA Unspecified multiple injuries, initial encounter: Secondary | ICD-10-CM

## 2019-07-10 DIAGNOSIS — M25512 Pain in left shoulder: Secondary | ICD-10-CM | POA: Diagnosis present
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
## 2019-07-16 ENCOUNTER — Encounter: Payer: Self-pay | Admitting: Orthopedic Surgery

## 2019-07-16 ENCOUNTER — Ambulatory Visit (INDEPENDENT_AMBULATORY_CARE_PROVIDER_SITE_OTHER): Payer: BC Managed Care – PPO | Admitting: Orthopedic Surgery

## 2019-07-16 ENCOUNTER — Other Ambulatory Visit: Payer: Self-pay

## 2019-07-16 DIAGNOSIS — S83511A Sprain of anterior cruciate ligament of right knee, initial encounter: Secondary | ICD-10-CM
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
## 2019-07-20 ENCOUNTER — Encounter: Payer: BC Managed Care – PPO | Admitting: Physical Medicine & Rehabilitation

## 2019-08-26 ENCOUNTER — Other Ambulatory Visit: Payer: Self-pay

## 2019-08-26 ENCOUNTER — Encounter
Payer: BC Managed Care – PPO | Attending: Physical Medicine & Rehabilitation | Admitting: Physical Medicine & Rehabilitation

## 2019-08-26 ENCOUNTER — Encounter: Payer: Self-pay | Admitting: Physical Medicine & Rehabilitation

## 2019-08-26 VITALS — BP 139/84 | HR 99 | Temp 97.8°F | Ht 70.0 in | Wt 232.0 lb

## 2019-08-26 DIAGNOSIS — H539 Unspecified visual disturbance: Secondary | ICD-10-CM | POA: Insufficient documentation

## 2019-08-26 DIAGNOSIS — M25562 Pain in left knee: Secondary | ICD-10-CM

## 2019-08-26 DIAGNOSIS — M25512 Pain in left shoulder: Secondary | ICD-10-CM | POA: Insufficient documentation

## 2019-08-26 DIAGNOSIS — T07XXXA Unspecified multiple injuries, initial encounter: Secondary | ICD-10-CM | POA: Insufficient documentation

## 2019-08-26 DIAGNOSIS — M7551 Bursitis of right shoulder: Secondary | ICD-10-CM | POA: Diagnosis present

## 2019-08-26 DIAGNOSIS — M25511 Pain in right shoulder: Secondary | ICD-10-CM | POA: Diagnosis not present

## 2019-08-26 DIAGNOSIS — M7552 Bursitis of left shoulder: Secondary | ICD-10-CM | POA: Diagnosis present

## 2019-08-26 DIAGNOSIS — M25561 Pain in right knee: Secondary | ICD-10-CM

## 2019-08-26 DIAGNOSIS — R269 Unspecified abnormalities of gait and mobility: Secondary | ICD-10-CM | POA: Insufficient documentation

## 2019-08-26 DIAGNOSIS — G8929 Other chronic pain: Secondary | ICD-10-CM

## 2019-08-26 MED ORDER — MELOXICAM 15 MG PO TABS: 15.0000 mg | ORAL_TABLET | Freq: Every day | ORAL | 1 refills | Status: DC
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
## 2019-08-27 ENCOUNTER — Encounter: Payer: Self-pay | Admitting: Orthopedic Surgery

## 2019-08-27 ENCOUNTER — Ambulatory Visit: Payer: BC Managed Care – PPO | Admitting: Orthopedic Surgery

## 2019-08-27 ENCOUNTER — Ambulatory Visit: Payer: Self-pay

## 2019-08-27 DIAGNOSIS — M79605 Pain in left leg: Secondary | ICD-10-CM | POA: Diagnosis not present

## 2019-08-27 NOTE — Addendum Note (Signed)
Addended byPrescott Parma on: 08/27/2019 11:24 AM   Modules accepted: Orders
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
## 2019-08-28 LAB — CBC WITH DIFFERENTIAL/PLATELET
Absolute Monocytes: 360 cells/uL (ref 200–950)
Basophils Absolute: 32 cells/uL (ref 0–200)
Basophils Relative: 0.7 %
Eosinophils Absolute: 180 cells/uL (ref 15–500)
Eosinophils Relative: 4 %
HCT: 50.3 % — ABNORMAL HIGH (ref 38.5–50.0)
Hemoglobin: 16.5 g/dL (ref 13.2–17.1)
Lymphs Abs: 1422 cells/uL (ref 850–3900)
MCH: 27.6 pg (ref 27.0–33.0)
MCHC: 32.8 g/dL (ref 32.0–36.0)
MCV: 84.3 fL (ref 80.0–100.0)
MPV: 11.1 fL (ref 7.5–12.5)
Monocytes Relative: 8 %
Neutro Abs: 2507 cells/uL (ref 1500–7800)
Neutrophils Relative %: 55.7 %
Platelets: 197 10*3/uL (ref 140–400)
RBC: 5.97 10*6/uL — ABNORMAL HIGH (ref 4.20–5.80)
RDW: 15.1 % — ABNORMAL HIGH (ref 11.0–15.0)
Total Lymphocyte: 31.6 %
WBC: 4.5 10*3/uL (ref 3.8–10.8)

## 2019-08-28 LAB — C-REACTIVE PROTEIN: CRP: 3.9 mg/L (ref <8.0)

## 2019-08-28 LAB — SEDIMENTATION RATE: Sed Rate: 2 mm/h (ref 0–15)

## 2019-08-30 ENCOUNTER — Other Ambulatory Visit: Payer: Self-pay

## 2019-08-30 ENCOUNTER — Ambulatory Visit (HOSPITAL_BASED_OUTPATIENT_CLINIC_OR_DEPARTMENT_OTHER)
Admission: RE | Admit: 2019-08-30 | Discharge: 2019-08-30 | Disposition: A | Discharge status: Home / Self Care | Payer: BC Managed Care – PPO | Source: Ambulatory Visit | Attending: Orthopedic Surgery | Admitting: Orthopedic Surgery

## 2019-08-30 DIAGNOSIS — M79605 Pain in left leg: Secondary | ICD-10-CM | POA: Diagnosis present

## 2019-08-30 IMAGING — CT CT FEMUR *L* W/O CM
3 series · 9 of 33 positions shown, 10 images · non-contrast
Comparison: X-ray [DATE], [DATE], [DATE]

CLINICAL DATA: Distal left femur fracture status post ORIF. Date of
injury [DATE]. Evaluate healing.

EXAM:
CT OF THE LOWER LEFT EXTREMITY WITHOUT CONTRAST
TECHNIQUE: Multidetector CT imaging of the lower left extremity was performed
according to the standard protocol.

[Series 3: axial bone · axial · 0.54mm/px · z∈[+584,+584]mm · 1 of 286 slices shown, 2 images]
[im 154/286  soft-tissue]
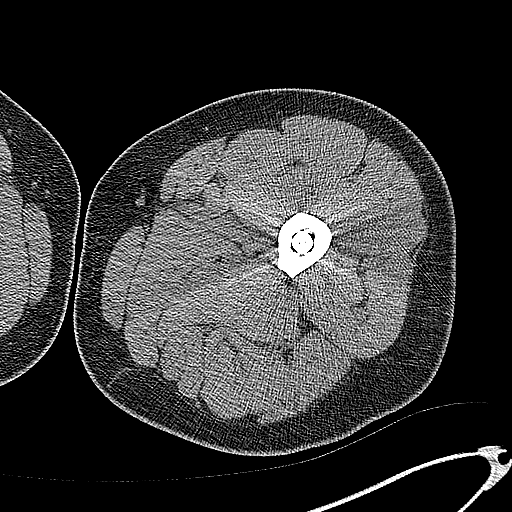
[im 154/286  bone]
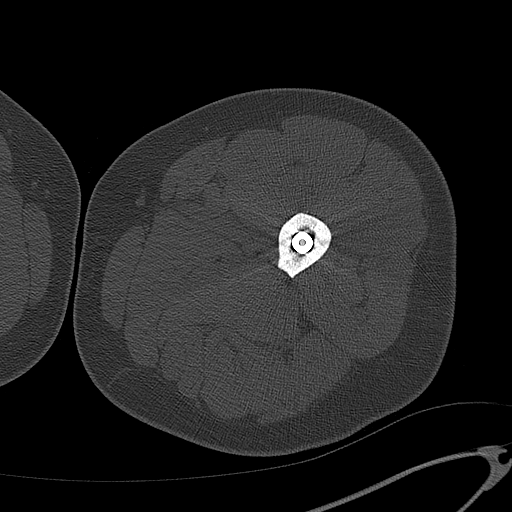

[Series 6: coronal st · coronal · 0.51mm/px · 3 of 127 slices shown]
[im 26/127  bone]
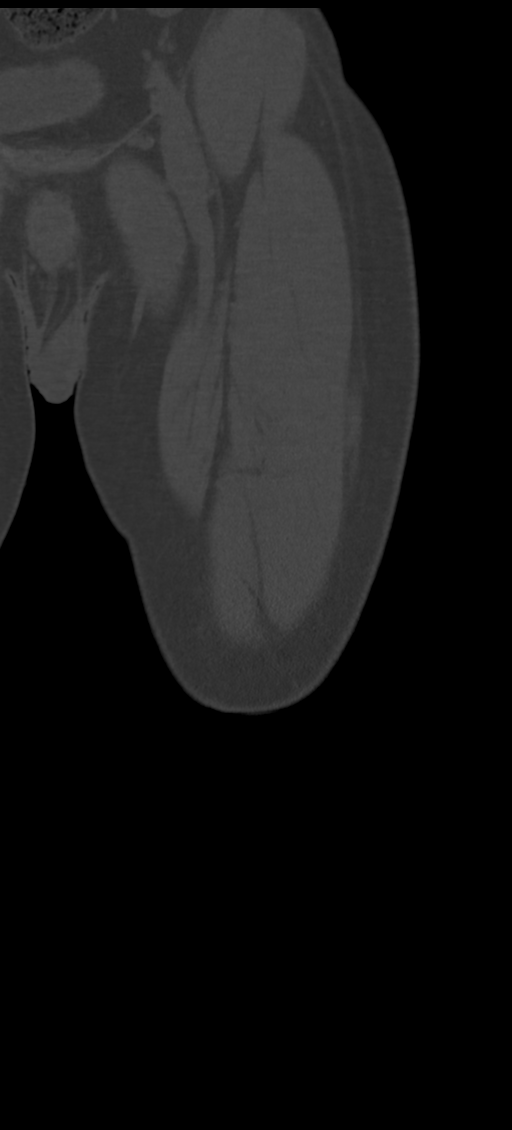
[im 51/127  bone]
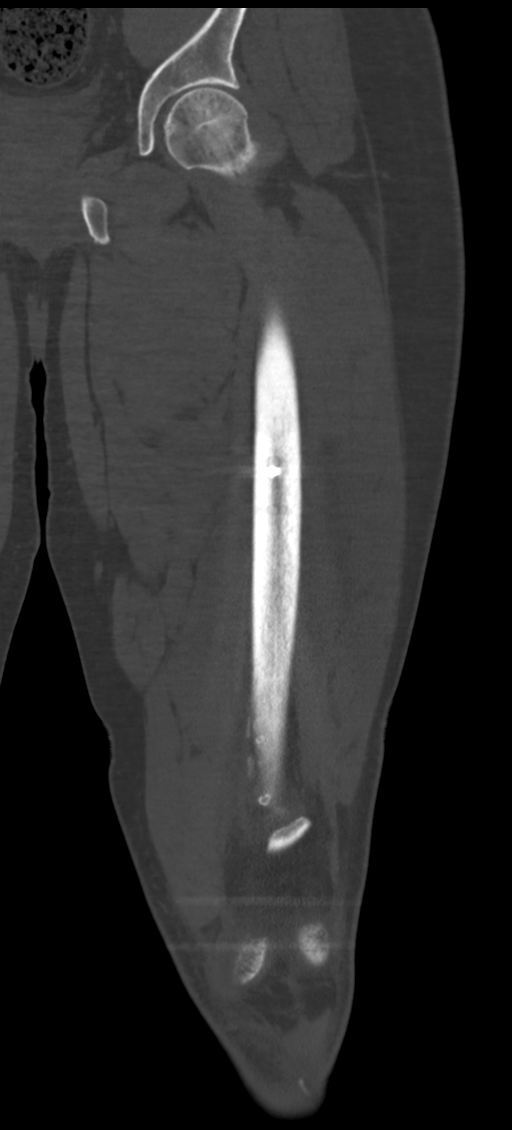
[im 76/127  bone]
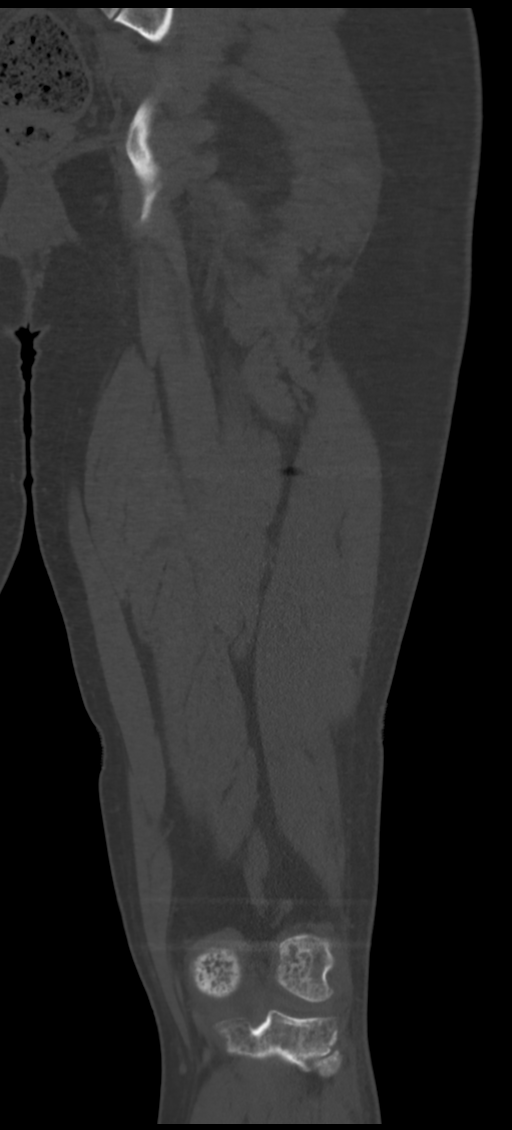

[Series 7: sagittal st · sagittal · 0.51mm/px · 5 of 94 slices shown]
[im 32/94  bone]
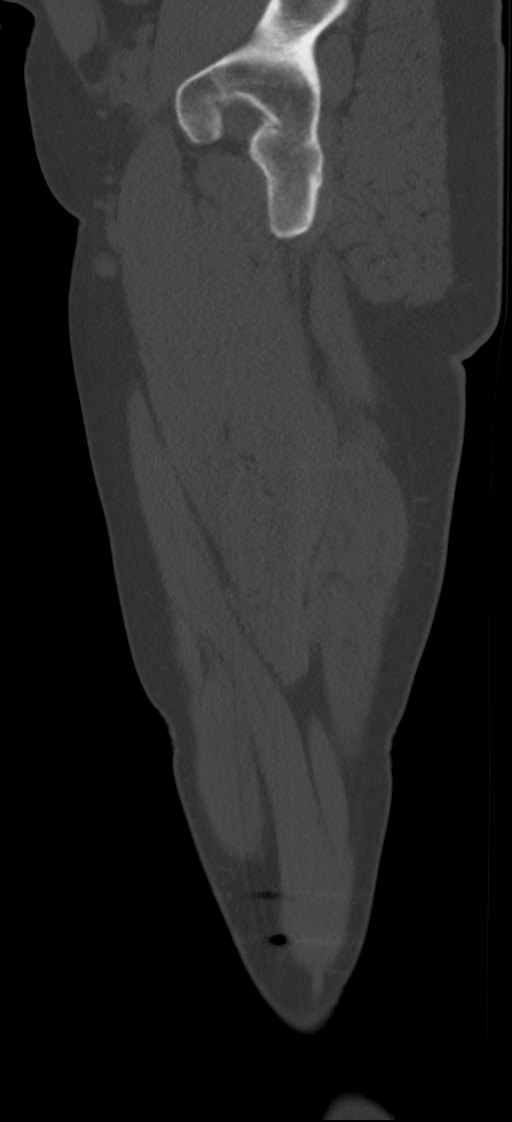
[im 39/94  bone]
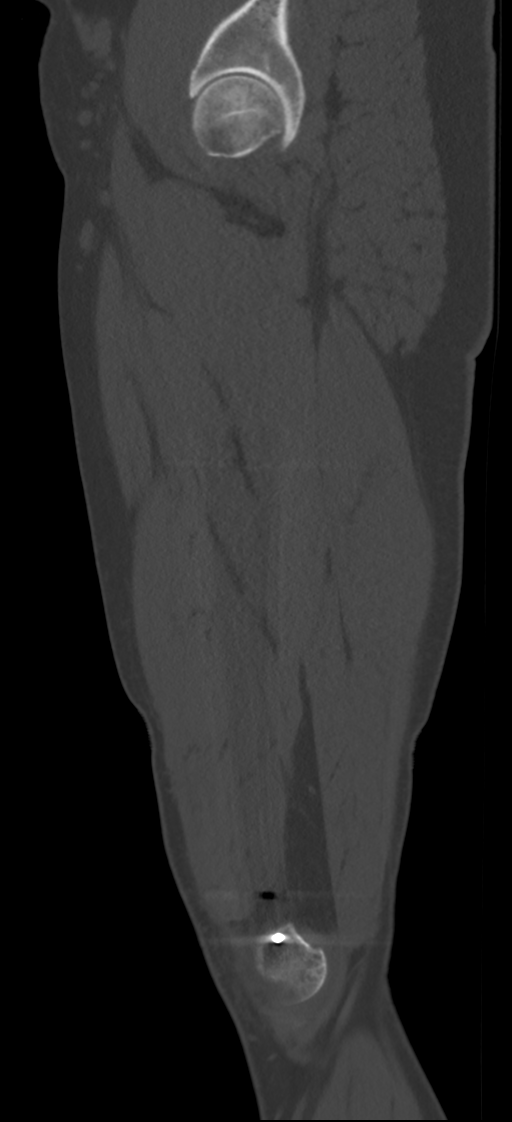
[im 47/94  bone]
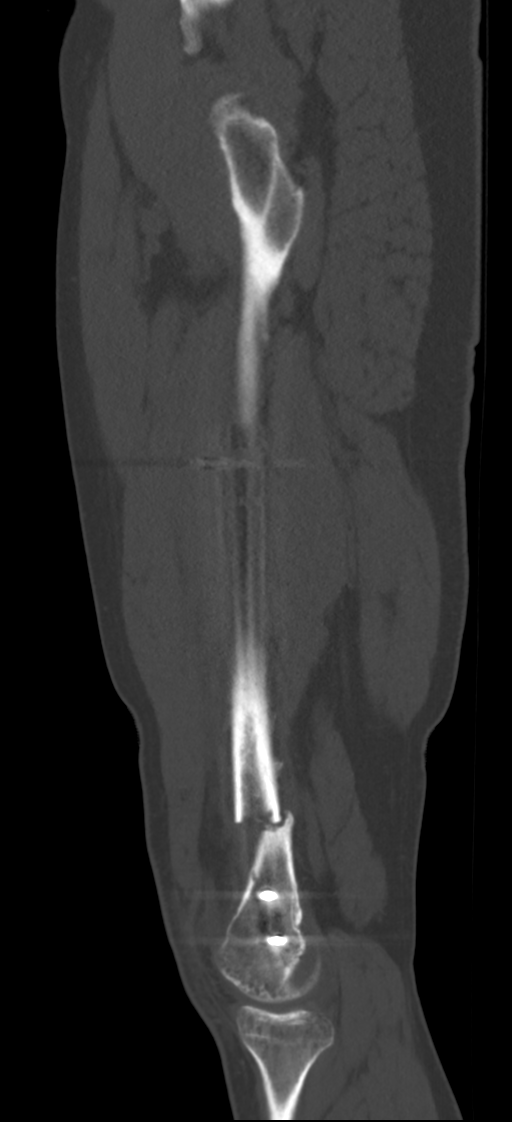
[im 55/94  bone]
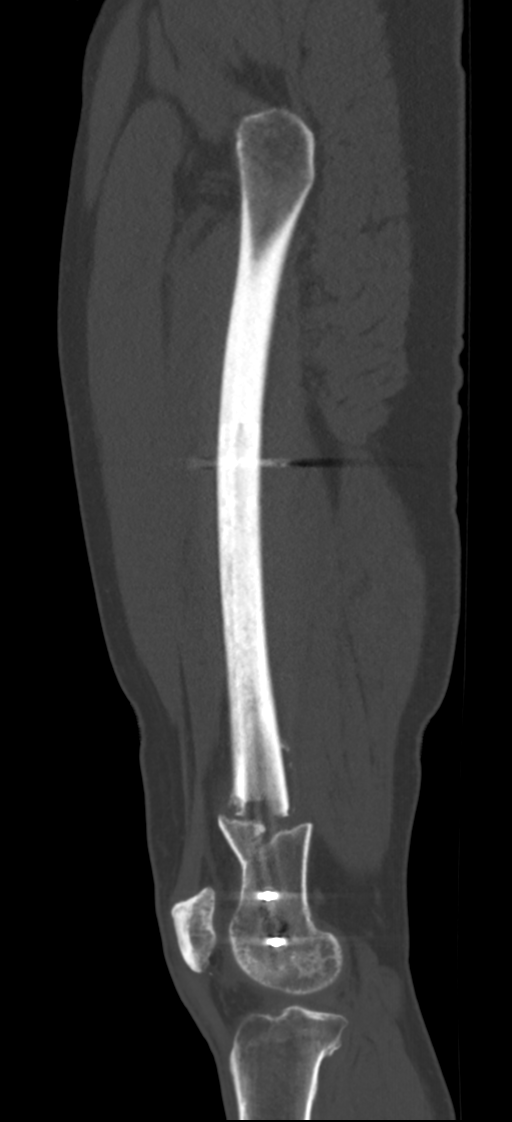
[im 63/94  bone]
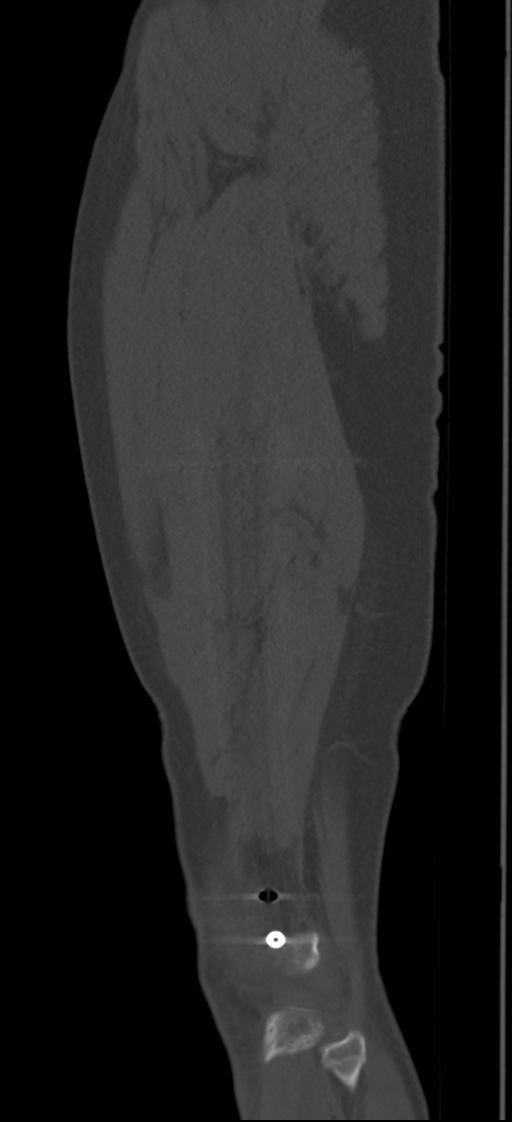

[9 of 33 positions shown; findings below may reference images not displayed]

FINDINGS: Bones/Joint/Cartilage

Transversely oriented fracture of the distal left femoral
metadiaphysis. Fracture alignment is unchanged with approximately 8
mm of lateral displacement and 5 mm of diastasis (series 5, image
78). Anterior cortex of the distal fracture component is somewhat
buckled. There is some periosteal new bone formation along the
proximal aspect of the fracture site anteriorly without bridging
bone traversing the fracture site (series 5, image 74). Retrograde
intramedullary nail with proximal and distal interlocking screws
traverses the fracture site. The distal most interlocking screw at
the level the femoral condyles is fractured at its midpoint (series
4, image 43). Thin perihardware lucency surrounding the IM nail at
the level of the distal femoral metaphysis and distal screws (series
4, images 40-42; series 3, image 216). No perihardware fracture. No
bony erosions or evidence to suggest infection.

No new fractures. Alignment at the left hip and knee are maintained.
Mild medial compartment joint space narrowing at the left knee.
Trace knee joint effusion.

Ligaments

Suboptimally assessed by CT.

Muscles and Tendons

Myotendinous structures appear grossly intact.

Soft tissues

No soft tissue fluid collection or hematoma.
IMPRESSION: 1. Unchanged alignment of distal left femoral metadiaphyseal
fracture. There is some periosteal new bone formation proximally
without bridging bone traversing the fracture site. Findings
compatible with delayed osseous union.
2. Thin perihardware lucency surrounding the IM nail at the level of
the distal femoral metaphysis and distal screws are suggestive of
hardware loosening. The distal most interlocking screw is fractured
at its midpoint.

## 2019-09-01 NOTE — Progress Notes (Signed)
I called Eric [name] today.  He does have what appears to be nonunion but not infected.  Discussed it with Dr. Lajoyce Corners who will see him back in follow-up and plan on some type of revision surgery.  Please get him into see Dr. Lajoyce Corners within the next day or 2.  Thanks and he will need to see Dr. Lajoyce Corners and not the [facility] as this is a decision for surgery.  Thank you

## 2019-09-04 ENCOUNTER — Other Ambulatory Visit (HOSPITAL_BASED_OUTPATIENT_CLINIC_OR_DEPARTMENT_OTHER): Payer: BC Managed Care – PPO

## 2019-09-06 ENCOUNTER — Other Ambulatory Visit: Payer: Self-pay

## 2019-09-06 ENCOUNTER — Ambulatory Visit: Payer: BC Managed Care – PPO | Admitting: Orthopedic Surgery

## 2019-09-06 ENCOUNTER — Encounter: Payer: Self-pay | Admitting: Orthopedic Surgery

## 2019-09-06 VITALS — Ht 70.0 in | Wt 232.0 lb

## 2019-09-06 DIAGNOSIS — S72365K Nondisplaced segmental fracture of shaft of left femur, subsequent encounter for closed fracture with nonunion: Secondary | ICD-10-CM
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
## 2019-09-07 ENCOUNTER — Other Ambulatory Visit: Payer: Self-pay | Admitting: Physician Assistant

## 2019-09-07 ENCOUNTER — Other Ambulatory Visit: Payer: Self-pay

## 2019-09-08 ENCOUNTER — Encounter (HOSPITAL_COMMUNITY): Payer: Self-pay | Admitting: Orthopedic Surgery

## 2019-09-08 ENCOUNTER — Other Ambulatory Visit: Payer: Self-pay

## 2019-09-08 ENCOUNTER — Other Ambulatory Visit (HOSPITAL_COMMUNITY)
Admission: RE | Admit: 2019-09-08 | Discharge: 2019-09-08 | Disposition: A | Discharge status: Home / Self Care | Payer: BC Managed Care – PPO | Source: Ambulatory Visit | Attending: Orthopedic Surgery | Admitting: Orthopedic Surgery

## 2019-09-08 DIAGNOSIS — Z01812 Encounter for preprocedural laboratory examination: Secondary | ICD-10-CM | POA: Diagnosis present

## 2019-09-08 DIAGNOSIS — Z20822 Contact with and (suspected) exposure to covid-19: Secondary | ICD-10-CM | POA: Diagnosis not present

## 2019-09-08 LAB — SARS CORONAVIRUS 2 (TAT 6-24 HRS): SARS Coronavirus 2: NEGATIVE

## 2019-09-08 NOTE — Progress Notes (Signed)
Patient denies shortness of breath, fever, cough and chest pain.  PCP - denies Cardiologist - denies  Chest x-ray - 04/18/19, 1 View EKG - denies Stress Test - denies ECHO - denies Cardiac Cath - denies  ERAS: Clears til 0615 DOS, no drink.  Anesthesia review: No  STOP now taking any Aspirin (unless otherwise instructed by your surgeon), Aleve, Naproxen, Ibuprofen, Motrin, Advil, Goody's, BC's, all herbal medications, fish oil, and all vitamins.   Coronavirus Screening Covid test on 09/08/19 is pending. Have you experienced the following symptoms:  Cough yes/no: No Fever (>100.18F)  yes/no: No Runny nose yes/no: No Sore throat yes/no: No Difficulty breathing/shortness of breath  yes/no: No  Have you traveled in the last 14 days and where? yes/no: No  Patient verbalized understanding of instructions that were given via phone.

## 2019-09-09 MED ORDER — DEXTROSE 5 % IV SOLN
3.0000 g | INTRAVENOUS | Status: DC
  Filled 2019-09-09 (×2): qty 3000

## 2019-09-10 ENCOUNTER — Encounter (HOSPITAL_COMMUNITY)
Admission: RE | Disposition: A | Discharge status: Home / Self Care | Payer: Self-pay | Source: Home / Self Care | Attending: Orthopedic Surgery

## 2019-09-10 ENCOUNTER — Encounter (HOSPITAL_COMMUNITY): Payer: Self-pay | Admitting: Orthopedic Surgery

## 2019-09-10 ENCOUNTER — Ambulatory Visit (HOSPITAL_COMMUNITY): Payer: BC Managed Care – PPO

## 2019-09-10 ENCOUNTER — Ambulatory Visit (HOSPITAL_COMMUNITY)
Admission: RE | Admit: 2019-09-10 | Discharge: 2019-09-10 | Disposition: A | Discharge status: Home / Self Care | Payer: BC Managed Care – PPO | Attending: Orthopedic Surgery | Admitting: Orthopedic Surgery

## 2019-09-10 ENCOUNTER — Other Ambulatory Visit: Payer: Self-pay

## 2019-09-10 ENCOUNTER — Ambulatory Visit (HOSPITAL_COMMUNITY): Payer: BC Managed Care – PPO | Admitting: Anesthesiology

## 2019-09-10 DIAGNOSIS — Z79899 Other long term (current) drug therapy: Secondary | ICD-10-CM | POA: Diagnosis not present

## 2019-09-10 DIAGNOSIS — S72325K Nondisplaced transverse fracture of shaft of left femur, subsequent encounter for closed fracture with nonunion: Secondary | ICD-10-CM

## 2019-09-10 DIAGNOSIS — S7292XN Unspecified fracture of left femur, subsequent encounter for open fracture type IIIA, IIIB, or IIIC with nonunion: Secondary | ICD-10-CM | POA: Insufficient documentation

## 2019-09-10 DIAGNOSIS — X58XXXD Exposure to other specified factors, subsequent encounter: Secondary | ICD-10-CM | POA: Insufficient documentation

## 2019-09-10 DIAGNOSIS — Z791 Long term (current) use of non-steroidal anti-inflammatories (NSAID): Secondary | ICD-10-CM | POA: Diagnosis not present

## 2019-09-10 DIAGNOSIS — S72325A Nondisplaced transverse fracture of shaft of left femur, initial encounter for closed fracture: Secondary | ICD-10-CM

## 2019-09-10 DIAGNOSIS — T84115D Breakdown (mechanical) of internal fixation device of left femur, subsequent encounter: Secondary | ICD-10-CM | POA: Insufficient documentation

## 2019-09-10 DIAGNOSIS — Z419 Encounter for procedure for purposes other than remedying health state, unspecified: Secondary | ICD-10-CM

## 2019-09-10 DIAGNOSIS — D649 Anemia, unspecified: Secondary | ICD-10-CM | POA: Insufficient documentation

## 2019-09-10 HISTORY — PX: HARDWARE REMOVAL: SHX979

## 2019-09-10 HISTORY — PX: ORIF FEMUR FRACTURE: SHX2119

## 2019-09-10 SURGERY — REMOVAL, HARDWARE
Anesthesia: General | Site: Leg Upper | Laterality: Left

## 2019-09-10 MED ORDER — OXYCODONE-ACETAMINOPHEN 5-325 MG PO TABS: 1.0000 | ORAL_TABLET | ORAL | 0 refills | Status: AC | PRN

## 2019-09-10 MED ORDER — FENTANYL CITRATE (PF) 250 MCG/5ML IJ SOLN
INTRAMUSCULAR | Status: AC
  Filled 2019-09-10: qty 5

## 2019-09-10 MED ORDER — CEFAZOLIN SODIUM-DEXTROSE 2-4 GM/100ML-% IV SOLN
2.0000 g | INTRAVENOUS | Status: AC
  Administered 2019-09-10: 2 g via INTRAVENOUS
  Filled 2019-09-10: qty 100

## 2019-09-10 MED ORDER — ONDANSETRON HCL 4 MG/2ML IJ SOLN
4.0000 mg | Freq: Four times a day (QID) | INTRAMUSCULAR | Status: AC | PRN
  Administered 2019-09-10: 4 mg via INTRAVENOUS

## 2019-09-10 MED ORDER — ASPIRIN EC 325 MG PO TBEC: 325.0000 mg | DELAYED_RELEASE_TABLET | Freq: Every day | ORAL | 0 refills | Status: AC

## 2019-09-10 MED ORDER — FENTANYL CITRATE (PF) 100 MCG/2ML IJ SOLN
25.0000 ug | INTRAMUSCULAR | Status: DC | PRN
  Administered 2019-09-10 (×2): 50 ug via INTRAVENOUS

## 2019-09-10 MED ORDER — OXYCODONE HCL 5 MG PO TABS
5.0000 mg | ORAL_TABLET | Freq: Once | ORAL | Status: AC | PRN
  Administered 2019-09-10: 5 mg via ORAL

## 2019-09-10 MED ORDER — ONDANSETRON HCL 4 MG/2ML IJ SOLN
INTRAMUSCULAR | Status: DC | PRN
  Administered 2019-09-10: 4 mg via INTRAVENOUS

## 2019-09-10 MED ORDER — CHLORHEXIDINE GLUCONATE 4 % EX LIQD: 60.0000 mL | Freq: Once | CUTANEOUS | Status: DC

## 2019-09-10 MED ORDER — FENTANYL CITRATE (PF) 100 MCG/2ML IJ SOLN
INTRAMUSCULAR | Status: DC | PRN
  Administered 2019-09-10: 100 ug via INTRAVENOUS
  Administered 2019-09-10 (×3): 50 ug via INTRAVENOUS
  Administered 2019-09-10: 100 ug via INTRAVENOUS
  Administered 2019-09-10: 50 ug via INTRAVENOUS
  Administered 2019-09-10: 100 ug via INTRAVENOUS

## 2019-09-10 MED ORDER — MIDAZOLAM HCL 2 MG/2ML IJ SOLN
INTRAMUSCULAR | Status: DC | PRN
  Administered 2019-09-10: 2 mg via INTRAVENOUS

## 2019-09-10 MED ORDER — MIDAZOLAM HCL 2 MG/2ML IJ SOLN
INTRAMUSCULAR | Status: AC
  Filled 2019-09-10: qty 2

## 2019-09-10 MED ORDER — LIDOCAINE 2% (20 MG/ML) 5 ML SYRINGE
INTRAMUSCULAR | Status: DC | PRN
  Administered 2019-09-10: 60 mg via INTRAVENOUS

## 2019-09-10 MED ORDER — FENTANYL CITRATE (PF) 100 MCG/2ML IJ SOLN
INTRAMUSCULAR | Status: AC
  Filled 2019-09-10: qty 2

## 2019-09-10 MED ORDER — ROCURONIUM BROMIDE 10 MG/ML (PF) SYRINGE
PREFILLED_SYRINGE | INTRAVENOUS | Status: DC | PRN
  Administered 2019-09-10: 20 mg via INTRAVENOUS
  Administered 2019-09-10: 30 mg via INTRAVENOUS
  Administered 2019-09-10: 60 mg via INTRAVENOUS
  Administered 2019-09-10: 20 mg via INTRAVENOUS

## 2019-09-10 MED ORDER — DEXAMETHASONE SODIUM PHOSPHATE 10 MG/ML IJ SOLN
INTRAMUSCULAR | Status: DC | PRN
  Administered 2019-09-10: 8 mg via INTRAVENOUS

## 2019-09-10 MED ORDER — PHENYLEPHRINE 40 MCG/ML (10ML) SYRINGE FOR IV PUSH (FOR BLOOD PRESSURE SUPPORT)
PREFILLED_SYRINGE | INTRAVENOUS | Status: DC | PRN
  Administered 2019-09-10: 80 ug via INTRAVENOUS

## 2019-09-10 MED ORDER — PROPOFOL 10 MG/ML IV BOLUS
INTRAVENOUS | Status: DC | PRN
  Administered 2019-09-10: 200 mg via INTRAVENOUS

## 2019-09-10 MED ORDER — ONDANSETRON HCL 4 MG/2ML IJ SOLN
INTRAMUSCULAR | Status: AC
  Filled 2019-09-10: qty 2

## 2019-09-10 MED ORDER — 0.9 % SODIUM CHLORIDE (POUR BTL) OPTIME
TOPICAL | Status: DC | PRN
  Administered 2019-09-10: 1000 mL

## 2019-09-10 MED ORDER — LACTATED RINGERS IV SOLN: INTRAVENOUS | Status: DC

## 2019-09-10 MED ORDER — OXYCODONE HCL 5 MG/5ML PO SOLN: 5.0000 mg | Freq: Once | ORAL | Status: AC | PRN

## 2019-09-10 MED ORDER — SUGAMMADEX SODIUM 200 MG/2ML IV SOLN
INTRAVENOUS | Status: DC | PRN
  Administered 2019-09-10 (×2): 200 mg via INTRAVENOUS

## 2019-09-10 SURGICAL SUPPLY — 79 items
BANDAGE ESMARK 6X9 LF (GAUZE/BANDAGES/DRESSINGS) IMPLANT
BIT DRILL CALIBRATED 4.3MMX365 (DRILL) IMPLANT
BIT DRILL CROWE PNT TWST 4.5MM (DRILL) IMPLANT
BLADE CLIPPER SURG (BLADE) IMPLANT
BLADE SURG 10 STRL SS (BLADE) ×3 IMPLANT
BNDG COHESIVE 4X5 TAN STRL (GAUZE/BANDAGES/DRESSINGS) ×2 IMPLANT
BNDG COHESIVE 6X5 TAN STRL LF (GAUZE/BANDAGES/DRESSINGS) ×6 IMPLANT
BNDG ESMARK 6X9 LF (GAUZE/BANDAGES/DRESSINGS)
BNDG GAUZE ELAST 4 BULKY (GAUZE/BANDAGES/DRESSINGS) ×3 IMPLANT
CLEANER TIP ELECTROSURG 2X2 (MISCELLANEOUS) ×3 IMPLANT
COVER MAYO STAND STRL (DRAPES) ×3 IMPLANT
COVER SURGICAL LIGHT HANDLE (MISCELLANEOUS) ×6 IMPLANT
COVER WAND RF STERILE (DRAPES) ×3 IMPLANT
CUFF TOURN SGL QUICK 34 (TOURNIQUET CUFF)
CUFF TOURN SGL QUICK 42 (TOURNIQUET CUFF) IMPLANT
CUFF TRNQT CYL 34X4.125X (TOURNIQUET CUFF) IMPLANT
DRAPE C-ARM 42X72 X-RAY (DRAPES) IMPLANT
DRAPE C-ARMOR (DRAPES) ×2 IMPLANT
DRAPE IMP U-DRAPE 54X76 (DRAPES) ×3 IMPLANT
DRAPE INCISE IOBAN 66X45 STRL (DRAPES) ×3 IMPLANT
DRAPE ORTHO SPLIT 77X108 STRL (DRAPES) ×2
DRAPE SURG ORHT 6 SPLT 77X108 (DRAPES) ×1 IMPLANT
DRAPE U-SHAPE 47X51 STRL (DRAPES) ×3 IMPLANT
DRILL CALIBRATED 4.3MMX365 (DRILL) ×3
DRILL CROWE POINT TWIST 4.5MM (DRILL) ×3
DRSG ADAPTIC 3X8 NADH LF (GAUZE/BANDAGES/DRESSINGS) ×3 IMPLANT
DRSG AQUACEL ADVANTAGE 4X5 (GAUZE/BANDAGES/DRESSINGS) ×2 IMPLANT
DRSG EMULSION OIL 3X3 NADH (GAUZE/BANDAGES/DRESSINGS) ×3 IMPLANT
DRSG PAD ABDOMINAL 8X10 ST (GAUZE/BANDAGES/DRESSINGS) ×6 IMPLANT
DURAPREP 26ML APPLICATOR (WOUND CARE) ×3 IMPLANT
ELECT REM PT RETURN 9FT ADLT (ELECTROSURGICAL) ×3
ELECTRODE REM PT RTRN 9FT ADLT (ELECTROSURGICAL) ×1 IMPLANT
EVACUATOR 1/8 PVC DRAIN (DRAIN) IMPLANT
EXTRACTOR CONICAL WINQ M (INSTRUMENTS) ×2 IMPLANT
EXTRACTOR CONICAL WINQ S (INSTRUMENTS) ×2 IMPLANT
EXTRACTOR THRD WINQ 5/16-24 (INSTRUMENTS) ×2 IMPLANT
GAUZE SPONGE 4X4 12PLY STRL (GAUZE/BANDAGES/DRESSINGS) ×4 IMPLANT
GLOVE BIOGEL PI IND STRL 9 (GLOVE) ×1 IMPLANT
GLOVE BIOGEL PI INDICATOR 9 (GLOVE) ×2
GLOVE SURG ORTHO 9.0 STRL STRW (GLOVE) ×3 IMPLANT
GOWN STRL REUS W/ TWL XL LVL3 (GOWN DISPOSABLE) ×3 IMPLANT
GOWN STRL REUS W/TWL XL LVL3 (GOWN DISPOSABLE) ×6
GUIDEWIRE BEAD TIP (WIRE) ×2 IMPLANT
KIT BASIN OR (CUSTOM PROCEDURE TRAY) ×3 IMPLANT
KIT TURNOVER KIT B (KITS) ×3 IMPLANT
MANIFOLD NEPTUNE II (INSTRUMENTS) ×3 IMPLANT
NAIL EXTRACTOR TAP (TAP) ×2 IMPLANT
NAIL FEM RETRO 13.5X240 (Nail) ×2 IMPLANT
NEEDLE 22X1 1/2 (OR ONLY) (NEEDLE) ×3 IMPLANT
NS IRRIG 1000ML POUR BTL (IV SOLUTION) ×3 IMPLANT
PACK ORTHO EXTREMITY (CUSTOM PROCEDURE TRAY) ×3 IMPLANT
PACK UNIVERSAL I (CUSTOM PROCEDURE TRAY) ×3 IMPLANT
PAD ABD 8X10 STRL (GAUZE/BANDAGES/DRESSINGS) ×2 IMPLANT
PAD ARMBOARD 7.5X6 YLW CONV (MISCELLANEOUS) ×6 IMPLANT
SCREW CORT TI DBL LEAD 5X50 (Screw) ×2 IMPLANT
SCREW CORT TI DBL LEAD 5X58 (Screw) ×2 IMPLANT
SCREW CORT TI DBL LEAD 5X75 (Screw) ×2 IMPLANT
SPONGE LAP 18X18 RF (DISPOSABLE) ×3 IMPLANT
STAPLER VISISTAT 35W (STAPLE) IMPLANT
STOCKINETTE IMPERVIOUS 9X36 MD (GAUZE/BANDAGES/DRESSINGS) IMPLANT
STOCKINETTE IMPERVIOUS LG (DRAPES) ×3 IMPLANT
SUCTION FRAZIER HANDLE 10FR (MISCELLANEOUS) ×2
SUCTION TUBE FRAZIER 10FR DISP (MISCELLANEOUS) ×1 IMPLANT
SUT ETHILON 2 0 PSLX (SUTURE) IMPLANT
SUT ETHILON 4 0 PS 2 18 (SUTURE) IMPLANT
SUT VIC AB 0 CT1 27 (SUTURE) ×2
SUT VIC AB 0 CT1 27XBRD ANBCTR (SUTURE) ×1 IMPLANT
SUT VIC AB 1 CTB1 27 (SUTURE) ×3 IMPLANT
SUT VIC AB 2-0 CT1 27 (SUTURE)
SUT VIC AB 2-0 CT1 TAPERPNT 27 (SUTURE) IMPLANT
SUT VIC AB 2-0 CTB1 (SUTURE) ×3 IMPLANT
SYR 20ML ECCENTRIC (SYRINGE) ×3 IMPLANT
TOWEL GREEN STERILE (TOWEL DISPOSABLE) ×3 IMPLANT
TOWEL GREEN STERILE FF (TOWEL DISPOSABLE) ×3 IMPLANT
TUBE CONNECTING 12'X1/4 (SUCTIONS) ×1
TUBE CONNECTING 12X1/4 (SUCTIONS) ×2 IMPLANT
UNDERPAD 30X30 (UNDERPADS AND DIAPERS) ×3 IMPLANT
WATER STERILE IRR 1000ML POUR (IV SOLUTION) ×6 IMPLANT
YANKAUER SUCT BULB TIP NO VENT (SUCTIONS) ×3 IMPLANT

## 2019-09-10 NOTE — Anesthesia Preprocedure Evaluation (Addendum)
Anesthesia Evaluation  Patient identified by MRN, date of birth, ID band Patient awake    Reviewed: Allergy & Precautions, H&P , NPO status , Patient's Chart, lab work & pertinent test results  Airway Mallampati: II   Neck ROM: full    Dental  (+) Teeth Intact, Dental Advisory Given   Pulmonary neg pulmonary ROS,    breath sounds clear to auscultation       Cardiovascular negative cardio ROS   Rhythm:regular Rate:Normal     Neuro/Psych    GI/Hepatic   Endo/Other    Renal/GU      Musculoskeletal   Abdominal   Peds  Hematology  (+) Blood dyscrasia, anemia ,   Anesthesia Other Findings   Reproductive/Obstetrics                            Anesthesia Physical Anesthesia Plan  ASA: II  Anesthesia Plan: General   Post-op Pain Management:    Induction: Intravenous  PONV Risk Score and Plan: 2 and Ondansetron, Dexamethasone, Midazolam and Treatment may vary due to age or medical condition  Airway Management Planned: Oral ETT  Additional Equipment:   Intra-op Plan:   Post-operative Plan: Extubation in OR  Informed Consent: I have reviewed the patients History and Physical, chart, labs and discussed the procedure including the risks, benefits and alternatives for the proposed anesthesia with the patient or authorized representative who has indicated his/her understanding and acceptance.       Plan Discussed with: CRNA, Anesthesiologist and Surgeon  Anesthesia Plan Comments:         Anesthesia Quick Evaluation

## 2019-09-10 NOTE — Op Note (Signed)
**Note Eric-Identified via Obfuscation** 09/10/2019  1:26 PM  PATIENT:  Eric [name]    PRE-OPERATIVE DIAGNOSIS:  Non-Union Left Femur Fracture  POST-OPERATIVE DIAGNOSIS:  Same  PROCEDURE:  REMOVAL DEEP HARDWARE, REVISION NAIL LEFT FEMUR C arm fluoroscopy.  SURGEON:  Nadara Mustard, MD  PHYSICIAN ASSISTANT:None ANESTHESIA:   General  PREOPERATIVE INDICATIONS:  Eric [name] is a  49 y.o. male with a diagnosis of Non-Union Left Femur Fracture who failed conservative measures and elected for surgical management.    The risks benefits and alternatives were discussed with the patient preoperatively including but not limited to the risks of infection, bleeding, nerve injury, cardiopulmonary complications, the need for revision surgery, among others, and the patient was willing to proceed.  OPERATIVE IMPLANTS: Biomet retrograde femoral nail 13.5 x 260 mm locked distally x3  @ENCIMAGES @  OPERATIVE FINDINGS: Fibrous nonunion distal femur fracture secondary to open fracture with soft tissue stripped off the muscles.  OPERATIVE PROCEDURE: Patient was brought the operating room and underwent a general anesthetic.  After adequate levels anesthesia were obtained patient was placed supine on the [facility] fracture table the left lower extremity was prepped using DuraPrep draped into a sterile field a timeout was called.  A lateral incision was made over the distal interlocks.  1 screw was removed under C-arm fluoroscopy the second screw had broken the lateral half of the screw was removed a 4.5 Steinmann pin was then placed through the nail and the medial aspect of the screw was driven out the medial aspect of the femur small stab incision was made and the screw was removed without complication.  A incision was then made midline patella and blunt dissection was carried down to the nail a curette and rondure were used to clean debris from the distal aspect of the nail.  This was then secured with a extraction bolt.  The Biomet extraction bolt did  not fit and we had used the conical extraction bolt from the broken screw removal set.  A incision was made anteriorly over the thigh blunt dissection was carried down to the screw in the proximal locking screw was removed without problems.  The nail was then removed this was a 10.5 mm nail.  The canal was then sequentially reamed up to 15 mm for the 13.5 mm nail.  The nail was inserted and 2 distal screws were placed and a third oblique screw was placed to lock the screw distally.  With reaming there was a large amount of fibrinous tissue within the canal that was extracted and this was reamed to good cortical bone.  The wounds were irrigated with normal saline the patella tendon was closed using 2-0 Vicryl the incisions were closed with 2-0 nylon sterile dressing was applied patient was extubated taken the PACU in stable condition.   DISCHARGE PLANNING:  Antibiotic duration: Preoperative antibiotics  Weightbearing: Weightbearing as tolerated  Pain medication: Percocet  Dressing care/ Wound VAC: Follow-up in the office to change the dressing  Ambulatory devices: Crutches  Discharge to: Home.  Follow-up: In the office 1 week post operative.
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
## 2019-09-10 NOTE — Transfer of Care (Signed)
Immediate Anesthesia Transfer of Care Note  Patient: De Nurse  Procedure(s) Performed: REMOVAL DEEP HARDWARE (Left Leg Upper) REVISION NAIL LEFT FEMUR (Left Leg Upper)  Patient Location: PACU  Anesthesia Type:General  Level of Consciousness: drowsy and confused  Airway & Oxygen Therapy: Patient Spontanous Breathing and Patient connected to nasal cannula oxygen  Post-op Assessment: Report given to RN, Post -op Vital signs reviewed and stable and Patient moving all extremities  Post vital signs: Reviewed and stable  Last Vitals:  Vitals Value Taken Time  BP 142/98 09/10/19 1325  Temp    Pulse 92 09/10/19 1330  Resp 22 09/10/19 1330  SpO2 94 % 09/10/19 1330  Vitals shown include unvalidated device data.  Last Pain:  Vitals:   09/10/19 1042  TempSrc: Oral  PainSc: 0-No pain      Patients Stated Pain Goal: 6 (09/10/19 0723)  Complications: No apparent anesthesia complications

## 2019-09-10 NOTE — Discharge Instructions (Signed)
General Anesthesia, Adult, Care After This sheet gives you information about how to care for yourself after your procedure. Your health care provider may also give you more specific instructions. If you have problems or questions, contact your health care provider. What can I expect after the procedure? After the procedure, the following side effects are common:  Pain or discomfort at the IV site.  Nausea.  Vomiting.  Sore throat.  Trouble concentrating.  Feeling cold or chills.  Weak or tired.  Sleepiness and fatigue.  Soreness and body aches. These side effects can affect parts of the body that were not involved in surgery. Follow these instructions at home:  For at least 24 hours after the procedure:  Have a responsible adult stay with you. It is important to have someone help care for you until you are awake and alert.  Rest as needed.  Do not: ? Participate in activities in which you could fall or become injured. ? Drive. ? Use heavy machinery. ? Drink alcohol. ? Take sleeping pills or medicines that cause drowsiness. ? Make important decisions or sign legal documents. ? Take care of children on your own. Eating and drinking  Follow any instructions from your health care provider about eating or drinking restrictions.  When you feel hungry, start by eating small amounts of foods that are soft and easy to digest (bland), such as toast. Gradually return to your regular diet.  Drink enough fluid to keep your urine pale yellow.  If you vomit, rehydrate by drinking water, juice, or clear broth. General instructions  If you have sleep apnea, surgery and certain medicines can increase your risk for breathing problems. Follow instructions from your health care provider about wearing your sleep device: ? Anytime you are sleeping, including during daytime naps. ? While taking prescription pain medicines, sleeping medicines, or medicines that make you drowsy.  Return to  your normal activities as told by your health care provider. Ask your health care provider what activities are safe for you.  Take over-the-counter and prescription medicines only as told by your health care provider.  If you smoke, do not smoke without supervision.  Keep all follow-up visits as told by your health care provider. This is important. Contact a health care provider if:  You have nausea or vomiting that does not get better with medicine.  You cannot eat or drink without vomiting.  You have pain that does not get better with medicine.  You are unable to pass urine.  You develop a skin rash.  You have a fever.  You have redness around your IV site that gets worse. Get help right away if:  You have difficulty breathing.  You have chest pain.  You have blood in your urine or stool, or you vomit blood. Summary  After the procedure, it is common to have a sore throat or nausea. It is also common to feel tired.  Have a responsible adult stay with you for the first 24 hours after general anesthesia. It is important to have someone help care for you until you are awake and alert.  When you feel hungry, start by eating small amounts of foods that are soft and easy to digest (bland), such as toast. Gradually return to your regular diet.  Drink enough fluid to keep your urine pale yellow.  Return to your normal activities as told by your health care provider. Ask your health care provider what activities are safe for you. This information is not   intended to replace advice given to you by your health care provider. Make sure you discuss any questions you have with your health care provider. Document Revised: 06/13/2017 Document Reviewed: 01/24/2017 Elsevier Patient Education  2020 Elsevier Inc.  

## 2019-09-10 NOTE — Anesthesia Procedure Notes (Addendum)
Procedure Name: Intubation Date/Time: 09/10/2019 11:27 AM Performed by: Janene Harvey, CRNA Pre-anesthesia Checklist: Patient identified, Emergency Drugs available, Suction available and Patient being monitored Patient Re-evaluated:Patient Re-evaluated prior to induction Oxygen Delivery Method: Circle system utilized Preoxygenation: Pre-oxygenation with 100% oxygen Induction Type: IV induction Ventilation: Oral airway inserted - appropriate to patient size and Two handed mask ventilation required Laryngoscope Size: Mac and 4 Grade View: Grade I Tube type: Oral Number of attempts: 1 Airway Equipment and Method: Stylet and Oral airway Placement Confirmation: ETT inserted through vocal cords under direct vision,  positive ETCO2 and breath sounds checked- equal and bilateral Secured at: 23 cm Tube secured with: Tape Dental Injury: Teeth and Oropharynx as per pre-operative assessment

## 2019-09-13 ENCOUNTER — Encounter: Payer: Self-pay | Admitting: *Deleted

## 2019-09-13 NOTE — Anesthesia Postprocedure Evaluation (Signed)
**Note Eric-Identified via Obfuscation** Anesthesia Post Note  Patient: Eric [name]  Procedure(s) Performed: REMOVAL DEEP HARDWARE (Left Leg Upper) REVISION NAIL LEFT FEMUR (Left Leg Upper)     Patient location during evaluation: PACU Anesthesia Type: General Level of consciousness: awake and alert Pain management: pain level controlled Vital Signs Assessment: post-procedure vital signs reviewed and stable Respiratory status: spontaneous breathing, nonlabored ventilation, respiratory function stable and patient connected to nasal cannula oxygen Cardiovascular status: blood pressure returned to baseline and stable Postop Assessment: no apparent nausea or vomiting Anesthetic complications: no    Last Vitals:  Vitals:   09/10/19 1440 09/10/19 1455  BP: (!) 149/89 (!) 135/95  Pulse: 85 78  Resp: 15 10  Temp:  36.6 C  SpO2: 93% 95%    Last Pain:  Vitals:   09/10/19 1455  TempSrc:   PainSc: 4                  Eric [name] S

## 2019-09-21 ENCOUNTER — Encounter: Payer: Self-pay | Admitting: Family

## 2019-09-21 ENCOUNTER — Ambulatory Visit (INDEPENDENT_AMBULATORY_CARE_PROVIDER_SITE_OTHER): Payer: BC Managed Care – PPO | Admitting: Family

## 2019-09-21 ENCOUNTER — Other Ambulatory Visit: Payer: Self-pay

## 2019-09-21 VITALS — Ht 70.0 in | Wt 232.0 lb

## 2019-09-21 DIAGNOSIS — S72365K Nondisplaced segmental fracture of shaft of left femur, subsequent encounter for closed fracture with nonunion: Secondary | ICD-10-CM

## 2019-09-22 ENCOUNTER — Encounter: Payer: Self-pay | Admitting: Family
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
## 2019-09-28 ENCOUNTER — Ambulatory Visit (INDEPENDENT_AMBULATORY_CARE_PROVIDER_SITE_OTHER): Payer: BC Managed Care – PPO | Admitting: Family

## 2019-09-28 ENCOUNTER — Ambulatory Visit (INDEPENDENT_AMBULATORY_CARE_PROVIDER_SITE_OTHER): Payer: BC Managed Care – PPO

## 2019-09-28 ENCOUNTER — Other Ambulatory Visit: Payer: Self-pay

## 2019-09-28 ENCOUNTER — Encounter: Payer: Self-pay | Admitting: Family

## 2019-09-28 DIAGNOSIS — S72365K Nondisplaced segmental fracture of shaft of left femur, subsequent encounter for closed fracture with nonunion: Secondary | ICD-10-CM
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
## 2019-09-29 ENCOUNTER — Telehealth: Payer: Self-pay | Admitting: Family

## 2019-09-29 NOTE — Telephone Encounter (Signed)
Faxed with claim number as requested.

## 2019-09-29 NOTE — Telephone Encounter (Signed)
Patient called.   His caseworker needs office visit notes from his last appointment faxed over.The claim number needs to be in the attention line.   Claim number [id number]  Fax: [phone]  Patient call back: [phone]

## 2019-10-06 ENCOUNTER — Ambulatory Visit: Payer: BC Managed Care – PPO | Attending: Family | Admitting: Physical Therapy

## 2019-10-06 ENCOUNTER — Other Ambulatory Visit: Payer: Self-pay

## 2019-10-06 ENCOUNTER — Encounter: Payer: Self-pay | Admitting: Physical Therapy

## 2019-10-06 DIAGNOSIS — M25662 Stiffness of left knee, not elsewhere classified: Secondary | ICD-10-CM | POA: Diagnosis present

## 2019-10-06 DIAGNOSIS — R29898 Other symptoms and signs involving the musculoskeletal system: Secondary | ICD-10-CM | POA: Insufficient documentation

## 2019-10-06 DIAGNOSIS — R2689 Other abnormalities of gait and mobility: Secondary | ICD-10-CM | POA: Diagnosis present

## 2019-10-06 DIAGNOSIS — M25562 Pain in left knee: Secondary | ICD-10-CM | POA: Diagnosis present
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
## 2019-10-11 ENCOUNTER — Other Ambulatory Visit: Payer: Self-pay

## 2019-10-11 ENCOUNTER — Ambulatory Visit: Payer: BC Managed Care – PPO

## 2019-10-11 DIAGNOSIS — M25662 Stiffness of left knee, not elsewhere classified: Secondary | ICD-10-CM

## 2019-10-11 DIAGNOSIS — M25562 Pain in left knee: Secondary | ICD-10-CM | POA: Diagnosis not present

## 2019-10-11 DIAGNOSIS — R29898 Other symptoms and signs involving the musculoskeletal system: Secondary | ICD-10-CM

## 2019-10-11 DIAGNOSIS — R2689 Other abnormalities of gait and mobility: Secondary | ICD-10-CM
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
## 2019-10-13 ENCOUNTER — Ambulatory Visit: Payer: BC Managed Care – PPO | Admitting: Physical Therapy

## 2019-10-13 ENCOUNTER — Encounter: Payer: Self-pay | Admitting: Physical Therapy

## 2019-10-13 ENCOUNTER — Other Ambulatory Visit: Payer: Self-pay

## 2019-10-13 DIAGNOSIS — R2689 Other abnormalities of gait and mobility: Secondary | ICD-10-CM

## 2019-10-13 DIAGNOSIS — M25562 Pain in left knee: Secondary | ICD-10-CM | POA: Diagnosis not present

## 2019-10-13 DIAGNOSIS — M25662 Stiffness of left knee, not elsewhere classified: Secondary | ICD-10-CM

## 2019-10-13 DIAGNOSIS — R29898 Other symptoms and signs involving the musculoskeletal system: Secondary | ICD-10-CM
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
## 2019-10-18 ENCOUNTER — Encounter: Payer: Self-pay | Admitting: Physical Therapy

## 2019-10-18 ENCOUNTER — Ambulatory Visit: Payer: BC Managed Care – PPO | Admitting: Physical Therapy

## 2019-10-18 ENCOUNTER — Other Ambulatory Visit: Payer: Self-pay

## 2019-10-18 DIAGNOSIS — M25562 Pain in left knee: Secondary | ICD-10-CM

## 2019-10-18 DIAGNOSIS — R29898 Other symptoms and signs involving the musculoskeletal system: Secondary | ICD-10-CM

## 2019-10-18 DIAGNOSIS — M25662 Stiffness of left knee, not elsewhere classified: Secondary | ICD-10-CM

## 2019-10-18 DIAGNOSIS — R2689 Other abnormalities of gait and mobility: Secondary | ICD-10-CM
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
## 2019-10-19 ENCOUNTER — Ambulatory Visit: Payer: BC Managed Care – PPO | Admitting: Orthopedic Surgery

## 2019-10-19 DIAGNOSIS — S72365K Nondisplaced segmental fracture of shaft of left femur, subsequent encounter for closed fracture with nonunion: Secondary | ICD-10-CM

## 2019-10-20 ENCOUNTER — Encounter: Payer: Self-pay | Admitting: Orthopedic Surgery
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
## 2019-10-21 ENCOUNTER — Other Ambulatory Visit: Payer: Self-pay

## 2019-10-21 ENCOUNTER — Encounter: Payer: Self-pay | Admitting: Physical Therapy

## 2019-10-21 ENCOUNTER — Ambulatory Visit: Payer: BC Managed Care – PPO | Admitting: Physical Therapy

## 2019-10-21 DIAGNOSIS — R2689 Other abnormalities of gait and mobility: Secondary | ICD-10-CM

## 2019-10-21 DIAGNOSIS — M25662 Stiffness of left knee, not elsewhere classified: Secondary | ICD-10-CM

## 2019-10-21 DIAGNOSIS — R29898 Other symptoms and signs involving the musculoskeletal system: Secondary | ICD-10-CM

## 2019-10-21 DIAGNOSIS — M25562 Pain in left knee: Secondary | ICD-10-CM
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
## 2019-10-26 ENCOUNTER — Ambulatory Visit: Payer: BC Managed Care – PPO | Attending: Family | Admitting: Physical Therapy

## 2019-10-26 ENCOUNTER — Encounter: Payer: Self-pay | Admitting: Physical Therapy

## 2019-10-26 ENCOUNTER — Other Ambulatory Visit: Payer: Self-pay

## 2019-10-26 DIAGNOSIS — R2689 Other abnormalities of gait and mobility: Secondary | ICD-10-CM

## 2019-10-26 DIAGNOSIS — R29898 Other symptoms and signs involving the musculoskeletal system: Secondary | ICD-10-CM | POA: Diagnosis present

## 2019-10-26 DIAGNOSIS — M25662 Stiffness of left knee, not elsewhere classified: Secondary | ICD-10-CM | POA: Diagnosis present

## 2019-10-26 DIAGNOSIS — M25562 Pain in left knee: Secondary | ICD-10-CM | POA: Diagnosis present
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
## 2019-10-28 ENCOUNTER — Ambulatory Visit: Payer: BC Managed Care – PPO | Admitting: Physical Medicine & Rehabilitation

## 2019-10-28 ENCOUNTER — Other Ambulatory Visit: Payer: Self-pay | Admitting: Physical Medicine & Rehabilitation

## 2019-11-02 ENCOUNTER — Encounter: Payer: Self-pay | Admitting: Physical Therapy

## 2019-11-02 ENCOUNTER — Ambulatory Visit: Payer: BC Managed Care – PPO | Admitting: Physical Therapy

## 2019-11-02 ENCOUNTER — Other Ambulatory Visit: Payer: Self-pay

## 2019-11-02 DIAGNOSIS — M25562 Pain in left knee: Secondary | ICD-10-CM

## 2019-11-02 DIAGNOSIS — R29898 Other symptoms and signs involving the musculoskeletal system: Secondary | ICD-10-CM

## 2019-11-02 DIAGNOSIS — R2689 Other abnormalities of gait and mobility: Secondary | ICD-10-CM

## 2019-11-02 DIAGNOSIS — M25662 Stiffness of left knee, not elsewhere classified: Secondary | ICD-10-CM
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
## 2019-11-03 ENCOUNTER — Ambulatory Visit: Payer: BC Managed Care – PPO

## 2019-11-03 DIAGNOSIS — R29898 Other symptoms and signs involving the musculoskeletal system: Secondary | ICD-10-CM

## 2019-11-03 DIAGNOSIS — R2689 Other abnormalities of gait and mobility: Secondary | ICD-10-CM

## 2019-11-03 DIAGNOSIS — M25662 Stiffness of left knee, not elsewhere classified: Secondary | ICD-10-CM

## 2019-11-03 DIAGNOSIS — M25562 Pain in left knee: Secondary | ICD-10-CM
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
## 2019-11-10 ENCOUNTER — Ambulatory Visit: Payer: BC Managed Care – PPO | Admitting: Physical Therapy

## 2019-11-10 ENCOUNTER — Other Ambulatory Visit: Payer: Self-pay

## 2019-11-10 ENCOUNTER — Encounter: Payer: Self-pay | Admitting: Physical Therapy

## 2019-11-10 DIAGNOSIS — M25562 Pain in left knee: Secondary | ICD-10-CM

## 2019-11-10 DIAGNOSIS — R2689 Other abnormalities of gait and mobility: Secondary | ICD-10-CM

## 2019-11-10 DIAGNOSIS — R29898 Other symptoms and signs involving the musculoskeletal system: Secondary | ICD-10-CM

## 2019-11-10 DIAGNOSIS — M25662 Stiffness of left knee, not elsewhere classified: Secondary | ICD-10-CM
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
## 2019-11-16 ENCOUNTER — Other Ambulatory Visit: Payer: Self-pay

## 2019-11-16 ENCOUNTER — Ambulatory Visit: Payer: BC Managed Care – PPO | Admitting: Physician Assistant

## 2019-11-16 ENCOUNTER — Ambulatory Visit: Payer: BC Managed Care – PPO

## 2019-11-16 ENCOUNTER — Ambulatory Visit (INDEPENDENT_AMBULATORY_CARE_PROVIDER_SITE_OTHER): Payer: BC Managed Care – PPO

## 2019-11-16 ENCOUNTER — Encounter: Payer: Self-pay | Admitting: Orthopedic Surgery

## 2019-11-16 VITALS — Ht 70.0 in | Wt 232.0 lb

## 2019-11-16 DIAGNOSIS — M898X5 Other specified disorders of bone, thigh: Secondary | ICD-10-CM
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
## 2019-11-17 ENCOUNTER — Ambulatory Visit: Payer: BC Managed Care – PPO

## 2019-11-17 DIAGNOSIS — R2689 Other abnormalities of gait and mobility: Secondary | ICD-10-CM

## 2019-11-17 DIAGNOSIS — M25662 Stiffness of left knee, not elsewhere classified: Secondary | ICD-10-CM

## 2019-11-17 DIAGNOSIS — M25562 Pain in left knee: Secondary | ICD-10-CM | POA: Diagnosis not present

## 2019-11-17 DIAGNOSIS — R29898 Other symptoms and signs involving the musculoskeletal system: Secondary | ICD-10-CM
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
## 2019-12-02 ENCOUNTER — Other Ambulatory Visit: Payer: Self-pay

## 2019-12-02 ENCOUNTER — Ambulatory Visit: Payer: BC Managed Care – PPO | Attending: Family

## 2019-12-02 DIAGNOSIS — R29898 Other symptoms and signs involving the musculoskeletal system: Secondary | ICD-10-CM | POA: Diagnosis present

## 2019-12-02 DIAGNOSIS — R2689 Other abnormalities of gait and mobility: Secondary | ICD-10-CM | POA: Diagnosis present

## 2019-12-02 DIAGNOSIS — M25562 Pain in left knee: Secondary | ICD-10-CM | POA: Diagnosis not present

## 2019-12-02 DIAGNOSIS — M25662 Stiffness of left knee, not elsewhere classified: Secondary | ICD-10-CM
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
## 2019-12-06 ENCOUNTER — Other Ambulatory Visit: Payer: Self-pay

## 2019-12-06 ENCOUNTER — Ambulatory Visit: Payer: BC Managed Care – PPO | Admitting: Physical Therapy

## 2019-12-06 ENCOUNTER — Encounter: Payer: Self-pay | Admitting: Physical Therapy

## 2019-12-06 DIAGNOSIS — R29898 Other symptoms and signs involving the musculoskeletal system: Secondary | ICD-10-CM

## 2019-12-06 DIAGNOSIS — M25662 Stiffness of left knee, not elsewhere classified: Secondary | ICD-10-CM

## 2019-12-06 DIAGNOSIS — M25562 Pain in left knee: Secondary | ICD-10-CM | POA: Diagnosis not present

## 2019-12-06 DIAGNOSIS — R2689 Other abnormalities of gait and mobility: Secondary | ICD-10-CM
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
## 2019-12-09 ENCOUNTER — Ambulatory Visit: Payer: BC Managed Care – PPO | Admitting: Physical Therapy

## 2019-12-09 ENCOUNTER — Encounter: Payer: Self-pay | Admitting: Physical Therapy

## 2019-12-09 ENCOUNTER — Other Ambulatory Visit: Payer: Self-pay

## 2019-12-09 DIAGNOSIS — M25662 Stiffness of left knee, not elsewhere classified: Secondary | ICD-10-CM

## 2019-12-09 DIAGNOSIS — R2689 Other abnormalities of gait and mobility: Secondary | ICD-10-CM

## 2019-12-09 DIAGNOSIS — M25562 Pain in left knee: Secondary | ICD-10-CM | POA: Diagnosis not present

## 2019-12-09 DIAGNOSIS — R29898 Other symptoms and signs involving the musculoskeletal system: Secondary | ICD-10-CM
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
## 2019-12-14 ENCOUNTER — Encounter: Payer: Self-pay | Admitting: Physician Assistant

## 2019-12-14 ENCOUNTER — Ambulatory Visit: Payer: BC Managed Care – PPO | Admitting: Physician Assistant

## 2019-12-14 ENCOUNTER — Ambulatory Visit (INDEPENDENT_AMBULATORY_CARE_PROVIDER_SITE_OTHER): Payer: BC Managed Care – PPO

## 2019-12-14 ENCOUNTER — Other Ambulatory Visit: Payer: Self-pay

## 2019-12-14 VITALS — Ht 70.0 in | Wt 232.0 lb

## 2019-12-14 DIAGNOSIS — M898X5 Other specified disorders of bone, thigh: Secondary | ICD-10-CM | POA: Diagnosis not present
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
## 2020-02-10 ENCOUNTER — Other Ambulatory Visit: Payer: Self-pay | Admitting: Physical Medicine & Rehabilitation
